# Patient Record
Sex: Female | Born: 1985 | Race: White | Hispanic: No | Marital: Married | Smoking: Current every day smoker
Health system: Southern US, Community
[De-identification: ages and names within clinical notes are randomized; demographics above are authoritative.]

## PROBLEM LIST (undated history)

## (undated) DIAGNOSIS — Z789 Other specified health status: Secondary | ICD-10-CM

## (undated) HISTORY — PX: WISDOM TOOTH EXTRACTION: SHX21

## (undated) HISTORY — PX: ANTERIOR CRUCIATE LIGAMENT REPAIR: SHX115

---

## 2011-05-06 LAB — OB RESULTS CONSOLE GC/CHLAMYDIA: Gonorrhea: NEGATIVE

## 2011-05-06 LAB — OB RESULTS CONSOLE HIV ANTIBODY (ROUTINE TESTING): HIV: NONREACTIVE

## 2011-05-28 NOTE — L&D Delivery Note (Signed)
Delivery Note  SVD viable female Apgars given by NICU team (not available now) over intact perineum with nuchal x 1 reduced.  Placenta delivered spontaneously intact with 3VC. Hymenal band tore during labor and was Repaired with 2-0 Chromic with good support and hemostasis noted and R/V exam confirms.  PH art was done.  [place] cord blood was not done.  Mother and baby were doing well.  EBL 300cc  Candice Camp, MD

## 2011-10-23 ENCOUNTER — Inpatient Hospital Stay (HOSPITAL_COMMUNITY)
Admission: AD | Admit: 2011-10-23 | Discharge: 2011-10-26 | DRG: 778 | Disposition: A | Discharge status: Home / Self Care | Payer: 59 | Source: Ambulatory Visit | Attending: Obstetrics and Gynecology | Admitting: Obstetrics and Gynecology

## 2011-10-23 ENCOUNTER — Encounter (HOSPITAL_COMMUNITY): Payer: Self-pay

## 2011-10-23 DIAGNOSIS — O4700 False labor before 37 completed weeks of gestation, unspecified trimester: Principal | ICD-10-CM | POA: Diagnosis present

## 2011-10-23 DIAGNOSIS — O169 Unspecified maternal hypertension, unspecified trimester: Secondary | ICD-10-CM

## 2011-10-23 DIAGNOSIS — O139 Gestational [pregnancy-induced] hypertension without significant proteinuria, unspecified trimester: Secondary | ICD-10-CM | POA: Diagnosis present

## 2011-10-23 DIAGNOSIS — O368390 Maternal care for abnormalities of the fetal heart rate or rhythm, unspecified trimester, not applicable or unspecified: Secondary | ICD-10-CM | POA: Diagnosis present

## 2011-10-23 HISTORY — DX: Other specified health status: Z78.9

## 2011-10-23 LAB — URINALYSIS, ROUTINE W REFLEX MICROSCOPIC
Hgb urine dipstick: NEGATIVE
Leukocytes, UA: NEGATIVE
Nitrite: NEGATIVE
Protein, ur: 30 mg/dL — AB
Specific Gravity, Urine: 1.02 (ref 1.005–1.030)
Urobilinogen, UA: 0.2 mg/dL (ref 0.0–1.0)

## 2011-10-23 LAB — URINE MICROSCOPIC-ADD ON

## 2011-10-23 LAB — COMPREHENSIVE METABOLIC PANEL
ALT: 14 U/L (ref 0–35)
Albumin: 2.9 g/dL — ABNORMAL LOW (ref 3.5–5.2)
Calcium: 9.2 mg/dL (ref 8.4–10.5)
GFR calc Af Amer: >90 mL/min (ref >90)
Glucose, Bld: 77 mg/dL (ref 70–99)
Sodium: 132 mEq/L — ABNORMAL LOW (ref 135–145)
Total Protein: 6.4 g/dL (ref 6.0–8.3)

## 2011-10-23 LAB — CBC
Hemoglobin: 13.1 g/dL (ref 12.0–15.0)
MCH: 28.9 pg (ref 26.0–34.0)
MCHC: 33.7 g/dL (ref 30.0–36.0)
RDW: 14 % (ref 11.5–15.5)

## 2011-10-23 LAB — URIC ACID: Uric Acid, Serum: 3.4 mg/dL (ref 2.4–7.0)

## 2011-10-23 LAB — PROTEIN / CREATININE RATIO, URINE
Protein Creatinine Ratio: 0.42 — ABNORMAL HIGH (ref 0.00–0.15)
Total Protein, Urine: 40.1 mg/dL

## 2011-10-23 MED ORDER — BETAMETHASONE SOD PHOS & ACET 6 (3-3) MG/ML IJ SUSP
12.0000 mg | Freq: Every day | INTRAMUSCULAR | Status: AC
  Administered 2011-10-23 – 2011-10-24 (×2): 12 mg via INTRAMUSCULAR
  Filled 2011-10-23 (×2): qty 2

## 2011-10-23 MED ORDER — ZOLPIDEM TARTRATE 10 MG PO TABS: 10.0000 mg | ORAL_TABLET | Freq: Every evening | ORAL | Status: DC | PRN

## 2011-10-23 MED ORDER — PRENATAL MULTIVITAMIN CH
1.0000 | ORAL_TABLET | Freq: Every day | ORAL | Status: DC
  Administered 2011-10-23 – 2011-10-26 (×4): 1 via ORAL
  Filled 2011-10-23 (×4): qty 1

## 2011-10-23 MED ORDER — CALCIUM CARBONATE ANTACID 500 MG PO CHEW: 2.0000 | CHEWABLE_TABLET | ORAL | Status: DC | PRN

## 2011-10-23 MED ORDER — ACETAMINOPHEN 325 MG PO TABS: 650.0000 mg | ORAL_TABLET | ORAL | Status: DC | PRN

## 2011-10-23 MED ORDER — DOCUSATE SODIUM 100 MG PO CAPS
100.0000 mg | ORAL_CAPSULE | Freq: Every day | ORAL | Status: DC
  Administered 2011-10-24 – 2011-10-26 (×3): 100 mg via ORAL
  Filled 2011-10-23 (×3): qty 1

## 2011-10-23 NOTE — H&P (Signed)
26 yo G1 @ 32+5 wks sent from office w/ elevated BP and contractions.  BP in office 140-150/80s.  Ctx Q3-9min while on NST.  No vb or lof.  Good FM.  Pt denies HA, n/v, and abd pain.  Past History - See [facility]ister  AF, VSS Gen - NAD Abd - gravid, NT Ext - NT, no clonus Cvx - closed per Dr Arelia Sneddon office exam  FFN + in office  A/P:  Admit 24 hr urine Rpt pre-e labs in am Bedrest BMZ Tocolysis prn

## 2011-10-23 NOTE — MAU Note (Signed)
Pt sent from MD office for PTL eval, and PIH eval, pt denies PIH s/s, no abnormal vag d/c changes or bleding. q

## 2011-10-23 NOTE — Plan of Care (Signed)
Problem: Consults Goal: Birthing Suites Patient Information Press F2 to bring up selections list   Pt < [redacted] weeks EGA     

## 2011-10-23 NOTE — MAU Provider Note (Signed)
Chief Complaint:  Hypertension   First Provider Initiated Contact with Patient 10/23/11 1509      HPI  Rebecca Richmond is a 26 y.o. G1P0 at [redacted]w[redacted]d sent from office with elevated BP.  She reports good fetal movement, denies LOF, vaginal bleeding, vaginal itching/burning, urinary symptoms, h/a, epigastric pain, visual disturbances, dizziness, n/v, or fever/chills.    Pregnancy Course: uncomplicated  Past Medical History: Past Medical History  Diagnosis Date  . No pertinent past medical history     Past Surgical History: Past Surgical History  Procedure Date  . Anterior cruciate ligament repair     R knee x2  . Wisdom tooth extraction     Family History: Family History  Problem Relation Age of Onset  . Anesthesia problems Neg Hx   . Hypotension Neg Hx   . Malignant hyperthermia Neg Hx   . Pseudochol deficiency Neg Hx     Social History: History  Substance Use Topics  . Smoking status: Never Smoker   . Smokeless tobacco: Never Used  . Alcohol Use: No    Allergies: No Known Allergies  Meds:  Prescriptions prior to admission  Medication Sig Dispense Refill  . acetaminophen (TYLENOL) 500 MG tablet Take 500 mg by mouth every 6 (six) hours as needed. pain      . fexofenadine-pseudoephedrine (ALLEGRA-D) 60-120 MG per tablet Take 1 tablet by mouth 2 (two) times daily. allergies      . Prenatal Vit-Fe Fumarate-FA (PRENATAL MULTIVITAMIN) TABS Take 1 tablet by mouth daily.          Physical Exam  Blood pressure 134/89, pulse 105, temperature 98 F (36.7 C), temperature source Oral, resp. rate 16, height 5' (1.524 m), weight 73.936 kg (163 lb), SpO2 100.00%. GENERAL: Well-developed, well-nourished female in no acute distress.  HEENT: normocephalic, good dentition HEART: normal rate RESP: normal effort ABDOMEN: Soft, nontender, nondistended, gravid.  EXTREMITIES: Nontender, no edema NEURO: alert and oriented  SPECULUM EXAM: Deferred Dilation: Closed Exam by:: dr.  Arelia Sneddon  Positive FFN by Dr Arelia Sneddon  FHT:  Baseline 135 , moderate variability, accelerations present, no decelerations Contractions: irregular q 2 mins   Labs: Results for orders placed during the hospital encounter of 10/23/11 (from the past 24 hour(s))  URINALYSIS, ROUTINE W REFLEX MICROSCOPIC     Status: Abnormal   Collection Time   10/23/11  1:34 PM      Component Value Range   Color, Urine YELLOW  YELLOW    APPearance CLEAR  CLEAR    Specific Gravity, Urine 1.020  1.005 - 1.030    pH 7.5  5.0 - 8.0    Glucose, UA NEGATIVE  NEGATIVE (mg/dL)   Hgb urine dipstick NEGATIVE  NEGATIVE    Bilirubin Urine NEGATIVE  NEGATIVE    Ketones, ur NEGATIVE  NEGATIVE (mg/dL)   Protein, ur 30 (*) NEGATIVE (mg/dL)   Urobilinogen, UA 0.2  0.0 - 1.0 (mg/dL)   Nitrite NEGATIVE  NEGATIVE    Leukocytes, UA NEGATIVE  NEGATIVE   URINE MICROSCOPIC-ADD ON     Status: Abnormal   Collection Time   10/23/11  1:34 PM      Component Value Range   Squamous Epithelial / LPF FEW (*) RARE    WBC, UA 0-2  <3 (WBC/hpf)  PROTEIN / CREATININE RATIO, URINE     Status: Abnormal   Collection Time   10/23/11  1:34 PM      Component Value Range   Creatinine, Urine 95.89  Total Protein, Urine 40.1     PROTEIN CREATININE RATIO 0.42 (*) 0.00 - 0.15   CBC     Status: Abnormal   Collection Time   10/23/11  3:15 PM      Component Value Range   WBC 15.7 (*) 4.0 - 10.5 (K/uL)   RBC 4.53  3.87 - 5.11 (MIL/uL)   Hemoglobin 13.1  12.0 - 15.0 (g/dL)   HCT 78.2  95.6 - 21.3 (%)   MCV 85.9  78.0 - 100.0 (fL)   MCH 28.9  26.0 - 34.0 (pg)   MCHC 33.7  30.0 - 36.0 (g/dL)   RDW 08.6  57.8 - 46.9 (%)   Platelets 208  150 - 400 (K/uL)  COMPREHENSIVE METABOLIC PANEL     Status: Abnormal   Collection Time   10/23/11  3:15 PM      Component Value Range   Sodium 132 (*) 135 - 145 (mEq/L)   Potassium 3.8  3.5 - 5.1 (mEq/L)   Chloride 101  96 - 112 (mEq/L)   CO2 22  19 - 32 (mEq/L)   Glucose, Bld 77  70 - 99 (mg/dL)   BUN 6  6  - 23 (mg/dL)   Creatinine, Ser 6.29  0.50 - 1.10 (mg/dL)   Calcium 9.2  8.4 - 52.8 (mg/dL)   Total Protein 6.4  6.0 - 8.3 (g/dL)   Albumin 2.9 (*) 3.5 - 5.2 (g/dL)   AST 19  0 - 37 (U/L)   ALT 14  0 - 35 (U/L)   Alkaline Phosphatase 139 (*) 39 - 117 (U/L)   Total Bilirubin 0.2 (*) 0.3 - 1.2 (mg/dL)   GFR calc non Af Amer >90  >90 (mL/min)   GFR calc Af Amer >90  >90 (mL/min)  URIC ACID     Status: Normal   Collection Time   10/23/11  3:15 PM      Component Value Range   Uric Acid, Serum 3.4  2.4 - 7.0 (mg/dL)    Assessment: Threatened preterm labor Hypertension in pregnancy   Plan: Called Dr Renaldo Fiddler to discuss findings and assessment Care assumed by Dr Renaldo Fiddler for admission to antepartum  LEFTWICH-KIRBY, Amarise Lillo 5/29/20133:18 PM

## 2011-10-23 NOTE — MAU Note (Signed)
Patient sent from the office for evaluation of elevated blood pressure. Patient denies any symptoms, contractions bleeding or leaking. Reports good fetal movement.

## 2011-10-24 LAB — COMPREHENSIVE METABOLIC PANEL
ALT: 13 U/L (ref 0–35)
AST: 17 U/L (ref 0–37)
Albumin: 2.7 g/dL — ABNORMAL LOW (ref 3.5–5.2)
Alkaline Phosphatase: 114 U/L (ref 39–117)
Chloride: 102 mEq/L (ref 96–112)
Potassium: 4.5 mEq/L (ref 3.5–5.1)
Total Bilirubin: 0.3 mg/dL (ref 0.3–1.2)

## 2011-10-24 LAB — CBC
Platelets: 194 10*3/uL (ref 150–400)
RDW: 13.8 % (ref 11.5–15.5)
WBC: 16.9 10*3/uL — ABNORMAL HIGH (ref 4.0–10.5)

## 2011-10-24 LAB — CREATININE CLEARANCE, URINE, 24 HOUR
Creatinine, Urine: 57.07 mg/dL
Urine Total Volume-CRCL: 2500 mL

## 2011-10-24 LAB — PROTEIN, URINE, 24 HOUR
Collection Interval-UPROT: 24 hours
Protein, 24H Urine: 250 mg/d — ABNORMAL HIGH (ref 50–100)
Urine Total Volume-UPROT: 2500 mL

## 2011-10-24 MED ORDER — SODIUM CHLORIDE 0.9 % IJ SOLN
3.0000 mL | Freq: Two times a day (BID) | INTRAMUSCULAR | Status: DC
  Administered 2011-10-24 – 2011-10-25 (×4): 3 mL via INTRAVENOUS

## 2011-10-24 MED ORDER — NIFEDIPINE 10 MG PO CAPS
10.0000 mg | ORAL_CAPSULE | Freq: Four times a day (QID) | ORAL | Status: DC
  Administered 2011-10-24 – 2011-10-26 (×9): 10 mg via ORAL
  Filled 2011-10-24 (×10): qty 1

## 2011-10-24 NOTE — Progress Notes (Signed)
This morning, the patient is sitting up and eating breakfast.  She has no complaints. Denies headache, vaginal bleeding or any cramping. She reports normal fetal movement.  Afebrile Vital signs stable Fetal heart rate is reactive Toco irregular contractions Abdomen is soft and non tender  IMPRESSION: IUP at 32 w 6 days Positive FFN Pre term contractions Gestational hypertension PLAN: Monitor for PTL. Cervix was long / closed on admission. Discussed with her signs/symptoms.  If discharged home tomorrow, may need to go home on bedrest. Complete 24 hour urine collection and steroid series.  Plan of care discussed with patient and her husband.

## 2011-10-24 NOTE — Plan of Care (Signed)
Problem: Phase I Progression Outcomes Goal: Contractions < 5-6/hour Outcome: Not Met (add Reason)  Contracting, notified md, orders received for procardia

## 2011-10-25 MED ORDER — TERBUTALINE SULFATE 1 MG/ML IJ SOLN
0.2500 mg | Freq: Once | INTRAMUSCULAR | Status: AC
  Administered 2011-10-25: 0.25 mg via SUBCUTANEOUS
  Filled 2011-10-25: qty 1

## 2011-10-25 NOTE — Progress Notes (Signed)
UR Chart review completed.  

## 2011-10-25 NOTE — Progress Notes (Signed)
Patient ID: Rebecca Richmond, female   DOB: February 13, 1986, 26 y.o.   MRN: [id number] Pt without complaints VSSAF BP 140/70s Labs Stable 24 Urine 250mg   DTRs 2/4 Abd Gravid Nt  IUP at 33 weeks ?PTL - stable on Procardia Gest HTN - stable Plan monitor to day.  Recheck labs in am, if stable consider oupt management DL

## 2011-10-26 LAB — COMPREHENSIVE METABOLIC PANEL
ALT: 11 U/L (ref 0–35)
AST: 13 U/L (ref 0–37)
CO2: 24 mEq/L (ref 19–32)
Chloride: 106 mEq/L (ref 96–112)
Creatinine, Ser: 0.6 mg/dL (ref 0.50–1.10)
GFR calc non Af Amer: >90 mL/min (ref >90)
Total Bilirubin: 0.1 mg/dL — ABNORMAL LOW (ref 0.3–1.2)

## 2011-10-26 LAB — CBC
Hemoglobin: 11.4 g/dL — ABNORMAL LOW (ref 12.0–15.0)
MCV: 87.8 fL (ref 78.0–100.0)
Platelets: 178 10*3/uL (ref 150–400)
RBC: 4.02 MIL/uL (ref 3.87–5.11)
WBC: 12.8 10*3/uL — ABNORMAL HIGH (ref 4.0–10.5)

## 2011-10-26 LAB — LACTATE DEHYDROGENASE: LDH: 128 U/L (ref 94–250)

## 2011-10-26 MED ORDER — NIFEDIPINE 10 MG PO CAPS: 10.0000 mg | ORAL_CAPSULE | Freq: Four times a day (QID) | ORAL | Status: DC

## 2011-10-26 NOTE — Discharge Instructions (Signed)
Hypertension During Pregnancy Hypertension is also called high blood pressure. Blood pressure moves blood in your body. Sometimes, the force that moves the blood becomes too strong. When you are pregnant, this condition should be watched carefully. It can cause problems for you and your baby. HOME CARE   Make and keep all of your doctor visits.   Take medicine as told by your doctor. Tell your doctor about all medicines you take.   Eat very little salt.   Exercise regularly.   Do not drink alcohol.   Do not smoke.   Do not have drinks with caffeine.   Lie on your left side when resting.  GET HELP RIGHT AWAY IF:  You have bad belly (abdominal) pain.   You have sudden puffiness (swelling) in the hands, ankles, or face.   You gain 4 pounds (1.8 kilograms) or more in 1 week.   You throw up (vomit) repeatedly.   You have bleeding from the vagina.   You do not feel the baby moving as much.   You have a headache.   You have blurred or double vision.   You have muscle twitching or spasms.   You have shortness of breath.   You have blue fingernails and lips.   You have blood in your pee (urine).  MAKE SURE YOU:  Understand these instructions.   Will watch your condition.   Will get help right away if you are not doing well.  Document Released: 06/15/2010 Document Revised: 05/02/2011 Document Reviewed: 12/28/2010 [employer] Patient Information 2012 [employer]. 

## 2011-10-26 NOTE — Discharge Summary (Signed)
  Pt without complaints today VSSAF BPS 110-130 / 70-80s   FHR 140s with accels Ctxs 2x/hr  Abd Gravid NT DTRs 2/4  Labs nl and stable  IUP at 33 1/7 Gest HTN - stable BP and Labs Modified BR and PIH warnings  Threatened PTL - FFN +, SP BMZ and stable on procardia with rare ctxs and no cx change Modified BR and FU in 2 days in office  DL

## 2011-11-04 ENCOUNTER — Inpatient Hospital Stay (HOSPITAL_COMMUNITY): Payer: 59 | Admitting: Anesthesiology

## 2011-11-04 ENCOUNTER — Encounter (HOSPITAL_COMMUNITY): Payer: Self-pay | Admitting: Anesthesiology

## 2011-11-04 ENCOUNTER — Encounter (HOSPITAL_COMMUNITY): Payer: Self-pay | Admitting: *Deleted

## 2011-11-04 ENCOUNTER — Inpatient Hospital Stay (HOSPITAL_COMMUNITY)
Admission: AD | Admit: 2011-11-04 | Discharge: 2011-11-06 | DRG: 775 | Disposition: A | Discharge status: Home / Self Care | Payer: 59 | Source: Ambulatory Visit | Attending: Obstetrics and Gynecology | Admitting: Obstetrics and Gynecology

## 2011-11-04 DIAGNOSIS — O26899 Other specified pregnancy related conditions, unspecified trimester: Secondary | ICD-10-CM | POA: Diagnosis present

## 2011-11-04 DIAGNOSIS — L299 Pruritus, unspecified: Secondary | ICD-10-CM | POA: Diagnosis present

## 2011-11-04 LAB — CBC
HCT: 42.6 % (ref 36.0–46.0)
Hemoglobin: 14.3 g/dL (ref 12.0–15.0)
MCH: 28.9 pg (ref 26.0–34.0)
MCHC: 33.6 g/dL (ref 30.0–36.0)
MCV: 86.1 fL (ref 78.0–100.0)

## 2011-11-04 MED ORDER — BUTORPHANOL TARTRATE 2 MG/ML IJ SOLN
1.0000 mg | Freq: Once | INTRAMUSCULAR | Status: AC
  Administered 2011-11-04: 1 mg via INTRAVENOUS

## 2011-11-04 MED ORDER — TERBUTALINE SULFATE 1 MG/ML IJ SOLN: 0.2500 mg | Freq: Once | INTRAMUSCULAR | Status: DC | PRN

## 2011-11-04 MED ORDER — ONDANSETRON HCL 4 MG/2ML IJ SOLN: 4.0000 mg | Freq: Four times a day (QID) | INTRAMUSCULAR | Status: DC | PRN

## 2011-11-04 MED ORDER — EPHEDRINE 5 MG/ML INJ
10.0000 mg | INTRAVENOUS | Status: DC | PRN
  Filled 2011-11-04: qty 4

## 2011-11-04 MED ORDER — SODIUM CHLORIDE 0.9 % IV SOLN
2.0000 g | Freq: Four times a day (QID) | INTRAVENOUS | Status: DC
  Administered 2011-11-04: 2 g via INTRAVENOUS
  Filled 2011-11-04 (×2): qty 2000

## 2011-11-04 MED ORDER — BUTORPHANOL TARTRATE 2 MG/ML IJ SOLN
INTRAMUSCULAR | Status: AC
  Administered 2011-11-04: 1 mg via INTRAVENOUS
  Filled 2011-11-04: qty 1

## 2011-11-04 MED ORDER — OXYTOCIN BOLUS FROM INFUSION
500.0000 mL | Freq: Once | INTRAVENOUS | Status: DC
  Filled 2011-11-04: qty 1000
  Filled 2011-11-04: qty 500

## 2011-11-04 MED ORDER — LACTATED RINGERS IV SOLN: 500.0000 mL | Freq: Once | INTRAVENOUS | Status: DC

## 2011-11-04 MED ORDER — FENTANYL 2.5 MCG/ML BUPIVACAINE 1/10 % EPIDURAL INFUSION (WH - ANES)
14.0000 mL/h | INTRAMUSCULAR | Status: DC
  Administered 2011-11-04: 14 mL/h via EPIDURAL
  Filled 2011-11-04 (×2): qty 60

## 2011-11-04 MED ORDER — LIDOCAINE HCL (PF) 1 % IJ SOLN
30.0000 mL | INTRAMUSCULAR | Status: DC | PRN
  Filled 2011-11-04: qty 30

## 2011-11-04 MED ORDER — EPHEDRINE 5 MG/ML INJ: 10.0000 mg | INTRAVENOUS | Status: DC | PRN

## 2011-11-04 MED ORDER — CITRIC ACID-SODIUM CITRATE 334-500 MG/5ML PO SOLN: 30.0000 mL | ORAL | Status: DC | PRN

## 2011-11-04 MED ORDER — OXYTOCIN 20 UNITS IN LACTATED RINGERS INFUSION - SIMPLE
125.0000 mL/h | Freq: Once | INTRAVENOUS | Status: DC
  Filled 2011-11-04: qty 1000

## 2011-11-04 MED ORDER — PRENATAL MULTIVITAMIN CH
1.0000 | ORAL_TABLET | Freq: Every day | ORAL | Status: DC
  Administered 2011-11-04: 1 via ORAL
  Filled 2011-11-04: qty 1

## 2011-11-04 MED ORDER — FENTANYL 2.5 MCG/ML BUPIVACAINE 1/10 % EPIDURAL INFUSION (WH - ANES)
INTRAMUSCULAR | Status: DC | PRN
  Administered 2011-11-04: 14 mL/h via EPIDURAL

## 2011-11-04 MED ORDER — ACETAMINOPHEN 325 MG PO TABS: 650.0000 mg | ORAL_TABLET | ORAL | Status: DC | PRN

## 2011-11-04 MED ORDER — TERBUTALINE SULFATE 1 MG/ML IJ SOLN
0.2500 mg | Freq: Once | INTRAMUSCULAR | Status: AC | PRN
  Administered 2011-11-04: 0.25 mg via SUBCUTANEOUS

## 2011-11-04 MED ORDER — TERBUTALINE SULFATE 1 MG/ML IJ SOLN
INTRAMUSCULAR | Status: AC
  Administered 2011-11-04: 0.25 mg via SUBCUTANEOUS
  Filled 2011-11-04: qty 1

## 2011-11-04 MED ORDER — OXYTOCIN 20 UNITS IN LACTATED RINGERS INFUSION - SIMPLE
1.0000 m[IU]/min | INTRAVENOUS | Status: DC
  Administered 2011-11-04: 1 m[IU]/min via INTRAVENOUS

## 2011-11-04 MED ORDER — ZOLPIDEM TARTRATE 10 MG PO TABS: 10.0000 mg | ORAL_TABLET | Freq: Every evening | ORAL | Status: DC | PRN

## 2011-11-04 MED ORDER — IBUPROFEN 600 MG PO TABS: 600.0000 mg | ORAL_TABLET | Freq: Four times a day (QID) | ORAL | Status: DC | PRN

## 2011-11-04 MED ORDER — OXYCODONE-ACETAMINOPHEN 5-325 MG PO TABS: 1.0000 | ORAL_TABLET | ORAL | Status: DC | PRN

## 2011-11-04 MED ORDER — DIPHENHYDRAMINE HCL 50 MG/ML IJ SOLN: 12.5000 mg | INTRAMUSCULAR | Status: DC | PRN

## 2011-11-04 MED ORDER — CALCIUM CARBONATE ANTACID 500 MG PO CHEW: 2.0000 | CHEWABLE_TABLET | ORAL | Status: DC | PRN

## 2011-11-04 MED ORDER — FLEET ENEMA 7-19 GM/118ML RE ENEM: 1.0000 | ENEMA | RECTAL | Status: DC | PRN

## 2011-11-04 MED ORDER — PHENYLEPHRINE 40 MCG/ML (10ML) SYRINGE FOR IV PUSH (FOR BLOOD PRESSURE SUPPORT)
80.0000 ug | PREFILLED_SYRINGE | INTRAVENOUS | Status: DC | PRN
  Filled 2011-11-04: qty 5

## 2011-11-04 MED ORDER — LACTATED RINGERS IV SOLN
INTRAVENOUS | Status: DC
  Administered 2011-11-04: 22:00:00 via INTRAVENOUS

## 2011-11-04 MED ORDER — NIFEDIPINE 10 MG PO CAPS
10.0000 mg | ORAL_CAPSULE | Freq: Four times a day (QID) | ORAL | Status: DC
Start: 2011-11-04 — End: 2011-11-04
  Administered 2011-11-04: 10 mg via ORAL
  Filled 2011-11-04: qty 1

## 2011-11-04 MED ORDER — LACTATED RINGERS IV SOLN: 500.0000 mL | INTRAVENOUS | Status: DC | PRN

## 2011-11-04 MED ORDER — LACTATED RINGERS IV SOLN
INTRAVENOUS | Status: DC
  Administered 2011-11-04: 17:00:00 via INTRAVENOUS
  Administered 2011-11-04: 500 mL via INTRAVENOUS

## 2011-11-04 MED ORDER — LIDOCAINE HCL (PF) 1 % IJ SOLN
INTRAMUSCULAR | Status: DC | PRN
  Administered 2011-11-04 (×2): 8 mL

## 2011-11-04 MED ORDER — DOCUSATE SODIUM 100 MG PO CAPS
100.0000 mg | ORAL_CAPSULE | Freq: Every day | ORAL | Status: DC
  Administered 2011-11-04: 100 mg via ORAL
  Filled 2011-11-04: qty 1

## 2011-11-04 MED ORDER — PHENYLEPHRINE 40 MCG/ML (10ML) SYRINGE FOR IV PUSH (FOR BLOOD PRESSURE SUPPORT): 80.0000 ug | PREFILLED_SYRINGE | INTRAVENOUS | Status: DC | PRN

## 2011-11-04 NOTE — Progress Notes (Signed)
Patient ID: Rebecca Richmond, female   DOB: [birth year], 26 y.o.   MRN: [id number] Pt not responding to meds/fluids now with more pain with ctxs VSSAF FHR 140s Ctxs q 3-4 Cx 7/80/BBOW  Now active labor Abx for GBS prophylaxis Antipate SVD DL

## 2011-11-04 NOTE — Anesthesia Procedure Notes (Signed)
Epidural Patient location during procedure: OB Start time: 11/04/2011 6:17 PM End time: 11/04/2011 6:21 PM Reason for block: procedure for pain  Staffing Anesthesiologist: Sandrea Hughs Performed by: anesthesiologist   Preanesthetic Checklist Completed: patient identified, site marked, surgical consent, pre-op evaluation, timeout performed, IV checked, risks and benefits discussed and monitors and equipment checked  Epidural Patient position: sitting Prep: site prepped and draped and DuraPrep Patient monitoring: continuous pulse ox and blood pressure Approach: midline Injection technique: LOR air  Needle:  Needle type: Tuohy  Needle gauge: 17 G Needle length: 9 cm Needle insertion depth: 5 cm cm Catheter type: closed end flexible Catheter size: 19 Gauge Catheter at skin depth: 10 cm Test dose: negative and Other  Assessment Sensory level: T8 Events: blood not aspirated, injection not painful, no injection resistance, negative IV test and no paresthesia

## 2011-11-04 NOTE — MAU Note (Signed)
Pt sent over from office due to ucs.  Had cervix checked in office and was 4cm.  Denies any painful ucs, feeling a lot of pressure.  Was closed last week when seen in MAU for blood pressure eval and cramping. + FM.  Denies any bleeding or leaking of fluid.

## 2011-11-04 NOTE — H&P (Signed)
Rebecca Richmond is a 26 y.o. female seen in the office for a ob visit and found to be contracting and 4 cm dilated.  had pos FFn 1 week ago and was hospitalized and sent home on procardia.  Stopped the procardia 3 days ago because of itchy rash on abd, with no resolution of sxs.  Sent to antepartum for eval.History OB History    Grav Para Term Preterm Abortions TAB SAB Ect Mult Living   1 0 0 0 0 0 0 0 0 0      Past Medical History  Diagnosis Date  . No pertinent past medical history    Past Surgical History  Procedure Date  . Anterior cruciate ligament repair     R knee x2  . Wisdom tooth extraction    Family History: family history is negative for Anesthesia problems, and Hypotension, and Malignant hyperthermia, and Pseudochol deficiency, . Social History:  reports that she has never smoked. She has never used smokeless tobacco. She reports that she does not drink alcohol or use illicit drugs.  ROS    Blood pressure 120/61, pulse 115, temperature 98.3 F (36.8 C), temperature source Oral, resp. rate 18, height 5' (1.524 m), weight 73.936 kg (163 lb). 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

## 2011-11-04 NOTE — Anesthesia Preprocedure Evaluation (Signed)

## 2011-11-04 NOTE — MAU Note (Signed)
Was here last wk, contrations- but not painful, started feeling crampy last night, had clear d/c- sent from office for further eval, pt is NOT ruptured.

## 2011-11-04 NOTE — Progress Notes (Signed)
Orders received to start pitocin.

## 2011-11-04 NOTE — MAU Note (Signed)
Pt was to be a direct admit per pt, Dr Rana Snare called-  Is to go to antenatal for monitoring and bed rest- antenatal to call for orders

## 2011-11-04 NOTE — Consult Note (Signed)
Neonatology Note:  Attendance at Delivery:  I was asked to attend this NSVD at 34 3/[redacted] weeks GA after onset of PTL. The mother is a G1P0 A pos, GBS unknown with threatened preterm labor on 5/29, received Betamethasone times 2 doses then. She had been on Procardia until 3 days ago. She began having contractions today and was admitted and treated with terbutaline. Once it was clear that labor would progress, Ampicillin was started due to unknown GBS status. Mother afebrile during labor. ROM 4 hours prior to delivery, fluid clear. Infant vigorous with good spontaneous cry and tone. Needed only minimal bulb suctioning. Ap 8/9. Lungs clear to ausc in DR, without distress. Held by parents briefly, then transported to the NICU in good condition.  Rebecca Kozicki, MD  

## 2011-11-05 ENCOUNTER — Encounter (HOSPITAL_COMMUNITY): Payer: Self-pay | Admitting: *Deleted

## 2011-11-05 LAB — CBC
Hemoglobin: 11.8 g/dL — ABNORMAL LOW (ref 12.0–15.0)
MCH: 28.6 pg (ref 26.0–34.0)
MCV: 85.7 fL (ref 78.0–100.0)
RBC: 4.12 MIL/uL (ref 3.87–5.11)

## 2011-11-05 MED ORDER — PRENATAL MULTIVITAMIN CH
1.0000 | ORAL_TABLET | Freq: Every day | ORAL | Status: DC
  Administered 2011-11-05 – 2011-11-06 (×2): 1 via ORAL
  Filled 2011-11-05 (×2): qty 1

## 2011-11-05 MED ORDER — LANOLIN HYDROUS EX OINT: TOPICAL_OINTMENT | CUTANEOUS | Status: DC | PRN

## 2011-11-05 MED ORDER — WITCH HAZEL-GLYCERIN EX PADS: 1.0000 "application " | MEDICATED_PAD | CUTANEOUS | Status: DC | PRN

## 2011-11-05 MED ORDER — PRENATAL MULTIVITAMIN CH: 1.0000 | ORAL_TABLET | Freq: Every day | ORAL | Status: DC

## 2011-11-05 MED ORDER — MEASLES, MUMPS & RUBELLA VAC ~~LOC~~ INJ
0.5000 mL | INJECTION | Freq: Once | SUBCUTANEOUS | Status: DC
  Filled 2011-11-05: qty 0.5

## 2011-11-05 MED ORDER — ONDANSETRON HCL 4 MG PO TABS: 4.0000 mg | ORAL_TABLET | ORAL | Status: DC | PRN

## 2011-11-05 MED ORDER — ZOLPIDEM TARTRATE 5 MG PO TABS: 5.0000 mg | ORAL_TABLET | Freq: Every evening | ORAL | Status: DC | PRN

## 2011-11-05 MED ORDER — DIPHENHYDRAMINE HCL 25 MG PO CAPS: 25.0000 mg | ORAL_CAPSULE | Freq: Four times a day (QID) | ORAL | Status: DC | PRN

## 2011-11-05 MED ORDER — ONDANSETRON HCL 4 MG/2ML IJ SOLN: 4.0000 mg | INTRAMUSCULAR | Status: DC | PRN

## 2011-11-05 MED ORDER — SENNOSIDES-DOCUSATE SODIUM 8.6-50 MG PO TABS
2.0000 | ORAL_TABLET | Freq: Every day | ORAL | Status: DC
  Administered 2011-11-05: 2 via ORAL

## 2011-11-05 MED ORDER — IBUPROFEN 600 MG PO TABS
600.0000 mg | ORAL_TABLET | Freq: Four times a day (QID) | ORAL | Status: DC
  Administered 2011-11-05 – 2011-11-06 (×7): 600 mg via ORAL
  Filled 2011-11-05 (×7): qty 1

## 2011-11-05 MED ORDER — OXYCODONE-ACETAMINOPHEN 5-325 MG PO TABS: 1.0000 | ORAL_TABLET | ORAL | Status: DC | PRN

## 2011-11-05 MED ORDER — MEDROXYPROGESTERONE ACETATE 150 MG/ML IM SUSP: 150.0000 mg | INTRAMUSCULAR | Status: DC | PRN

## 2011-11-05 MED ORDER — BENZOCAINE-MENTHOL 20-0.5 % EX AERO
1.0000 "application " | INHALATION_SPRAY | CUTANEOUS | Status: DC | PRN
  Administered 2011-11-06: 1 via TOPICAL
  Filled 2011-11-05: qty 56

## 2011-11-05 MED ORDER — SIMETHICONE 80 MG PO CHEW
80.0000 mg | CHEWABLE_TABLET | ORAL | Status: DC | PRN
  Administered 2011-11-06: 80 mg via ORAL

## 2011-11-05 MED ORDER — TETANUS-DIPHTH-ACELL PERTUSSIS 5-2.5-18.5 LF-MCG/0.5 IM SUSP: 0.5000 mL | Freq: Once | INTRAMUSCULAR | Status: DC

## 2011-11-05 MED ORDER — DIBUCAINE 1 % RE OINT: 1.0000 "application " | TOPICAL_OINTMENT | RECTAL | Status: DC | PRN

## 2011-11-05 NOTE — Anesthesia Postprocedure Evaluation (Signed)
  Anesthesia Post-op Note  Patient: Rebecca Richmond  Procedure(s) Performed: * No procedures listed *  Patient Location: PACU and Mother/Baby  Anesthesia Type: Spinal  Level of Consciousness: awake, alert  and oriented  Airway and Oxygen Therapy: Patient Spontanous Breathing  Post-op Pain: mild  Post-op Assessment: Patient's Cardiovascular Status Stable, Respiratory Function Stable, No signs of Nausea or vomiting, No headache, No backache, No residual numbness and No residual motor weakness  Post-op Vital Signs: stable  Complications: No apparent anesthesia complications

## 2011-11-05 NOTE — Progress Notes (Signed)
UR Chart review completed.  

## 2011-11-05 NOTE — Progress Notes (Signed)
Post Partum Day one Subjective: no complaints, up ad lib, voiding, tolerating PO and + flatus  Objective: Blood pressure 120/79, pulse 89, temperature 98.4 F (36.9 C), temperature source Oral, resp. rate 20, height 5' (1.524 m), weight 73.936 kg (163 lb), SpO2 95.00%, unknown if currently breastfeeding.  Physical Exam:  General: alert Lochia: appropriate Uterine Fundus: firm Incision: healing well DVT Evaluation: No evidence of DVT seen on physical exam.   Basename 11/05/11 0535 11/04/11 1315  HGB 11.8* 14.3  HCT 35.3* 42.6    Assessment/Plan: Plan for discharge tomorrow   LOS: 1 day   Rebecca Richmond S 11/05/2011, 7:47 AM

## 2011-11-06 ENCOUNTER — Encounter (HOSPITAL_COMMUNITY)
Admission: RE | Admit: 2011-11-06 | Discharge: 2011-11-06 | Disposition: A | Discharge status: Home / Self Care | Payer: 59 | Source: Ambulatory Visit | Attending: Obstetrics and Gynecology | Admitting: Obstetrics and Gynecology

## 2011-11-06 ENCOUNTER — Encounter (HOSPITAL_COMMUNITY): Payer: Self-pay | Admitting: *Deleted

## 2011-11-06 DIAGNOSIS — O923 Agalactia: Secondary | ICD-10-CM | POA: Insufficient documentation

## 2011-11-06 MED ORDER — IBUPROFEN 600 MG PO TABS: 600.0000 mg | ORAL_TABLET | Freq: Four times a day (QID) | ORAL | Status: AC

## 2011-11-06 NOTE — Progress Notes (Signed)
Post Partum Day 2 Subjective: no complaints, up ad lib, voiding, tolerating PO, + flatus and baby stable in NICU  Objective: Blood pressure 119/74, pulse 76, temperature 97.8 F (36.6 C), temperature source Oral, resp. rate 18, height 5' (1.524 m), weight 73.936 kg (163 lb), SpO2 96.00%, unknown if currently breastfeeding.  Physical Exam:  General: alert and cooperative Lochia: appropriate Uterine Fundus: firm Incision: perineum intact DVT Evaluation: No evidence of DVT seen on physical exam.   Basename 11/05/11 0535 11/04/11 1315  HGB 11.8* 14.3  HCT 35.3* 42.6    Assessment/Plan: Discharge home   LOS: 2 days   Danique Hartsough G 11/06/2011, 8:16 AM

## 2011-11-06 NOTE — Progress Notes (Signed)
Pt ambulated out  Tolerated  Well  Teaching complete

## 2011-11-06 NOTE — Discharge Summary (Signed)
Obstetric Discharge Summary Reason for Admission: onset of labor Prenatal Procedures: ultrasound Intrapartum Procedures: spontaneous vaginal delivery Postpartum Procedures: none Complications-Operative and Postpartum: none Hemoglobin  Date Value Range Status  11/05/2011 11.8* 12.0 - 15.0 g/dL Final     REPEATED TO VERIFY     DELTA CHECK NOTED     HCT  Date Value Range Status  11/05/2011 35.3* 36.0 - 46.0 % Final    Physical Exam:  General: alert and cooperative Lochia: appropriate Uterine Fundus: firm Incision: perineum intact DVT Evaluation: No evidence of DVT seen on physical exam.  Discharge Diagnoses: s/p vag preterm delivery at 34 weeks  Discharge Information: Date: 11/06/2011 Activity: pelvic rest Diet: routine Medications: PNV and Ibuprofen Condition: stable Instructions: refer to practice specific booklet Discharge to: home   Newborn Data: Live born female  Birth Weight: 5 lb 11.3 oz (2588 g) APGAR: 8, 9  Home with nicu.  Rebecca Richmond G 11/06/2011, 8:40 AM

## 2014-03-28 ENCOUNTER — Encounter (HOSPITAL_COMMUNITY): Payer: Self-pay | Admitting: *Deleted

## 2014-06-20 ENCOUNTER — Ambulatory Visit (INDEPENDENT_AMBULATORY_CARE_PROVIDER_SITE_OTHER): Payer: 59 | Admitting: Physician Assistant

## 2014-06-20 VITALS — BP 142/88 | HR 107 | Temp 99.1°F | Resp 20 | Ht 60.0 in | Wt 149.0 lb

## 2014-06-20 DIAGNOSIS — J019 Acute sinusitis, unspecified: Secondary | ICD-10-CM

## 2014-06-20 DIAGNOSIS — R05 Cough: Secondary | ICD-10-CM

## 2014-06-20 DIAGNOSIS — R059 Cough, unspecified: Secondary | ICD-10-CM

## 2014-06-20 DIAGNOSIS — J029 Acute pharyngitis, unspecified: Secondary | ICD-10-CM

## 2014-06-20 LAB — POCT RAPID STREP A (OFFICE): RAPID STREP A SCREEN: NEGATIVE

## 2014-06-20 MED ORDER — HYDROCOD POLST-CHLORPHEN POLST 10-8 MG/5ML PO LQCR: 5.0000 mL | Freq: Two times a day (BID) | ORAL | Status: DC | PRN

## 2014-06-20 MED ORDER — AMOXICILLIN 875 MG PO TABS: 875.0000 mg | ORAL_TABLET | Freq: Two times a day (BID) | ORAL | Status: AC

## 2014-06-20 MED ORDER — IPRATROPIUM BROMIDE 0.03 % NA SOLN: 2.0000 | Freq: Two times a day (BID) | NASAL | Status: DC

## 2014-06-20 NOTE — Patient Instructions (Signed)
Take antibiotic until finished. Use nasal spray twice a day. Use cough syrup at night. I will let you know the results of your throat culture. Return if not improving in 7-10 days.

## 2014-06-20 NOTE — Progress Notes (Signed)
Subjective:    Patient ID: Rebecca Richmond, female    DOB: Jun 04, 1985, 29 y.o.   MRN: [id number]  HPI  This is a 29 year old female with no PMH who is presenting with 2 weeks of URI symptoms - cough, nasal congestion, sore throat, sinus pressure. She reports she took ibuprofen, mucinex and theraflu and was helping some. 2 days ago her symptoms started to worsen. She reports she is having severe sore throat especially on the left side. Cough is productive of green sputum especially in the mornings. She is not sleeping well at night d/t cough. She was initially having nasal discharge however she is no longer having discharge and instead is having sinus pressure and postnasal drip. She states her teeth hurt when she eats. She denies fever, chills, otalgia, SOB or wheezing. She does not have a history of asthma and is not a smoker.  Review of Systems  Constitutional: Negative for fever and chills.  HENT: Positive for congestion, sinus pressure and sore throat. Negative for ear pain.   Eyes: Negative for redness.  Respiratory: Positive for cough. Negative for shortness of breath and wheezing.   Gastrointestinal: Negative for nausea, vomiting, abdominal pain and diarrhea.  Skin: Negative for rash.  Allergic/Immunologic: Positive for environmental allergies (spring time).  Neurological: Positive for headaches.  Hematological: Negative for adenopathy.  Psychiatric/Behavioral: Positive for sleep disturbance.   There are no active problems to display for this patient.  Prior to Admission medications   Medication Sig Start Date End Date Taking? Authorizing Provider  acetaminophen (TYLENOL) 500 MG tablet Take 500 mg by mouth every 6 (six) hours as needed. pain   Yes Historical Provider, MD   No Known Allergies  Patient's social and family history were reviewed.     Objective:   Physical Exam  Constitutional: She is oriented to person, place, and time. She appears well-developed and  well-nourished. No distress.  HENT:  Head: Normocephalic and atraumatic.  Right Ear: Hearing, external ear and ear canal normal. Tympanic membrane is retracted.  Left Ear: Hearing, external ear and ear canal normal.  Nose: Right sinus exhibits no maxillary sinus tenderness and no frontal sinus tenderness. Left sinus exhibits maxillary sinus tenderness. Left sinus exhibits no frontal sinus tenderness.  Mouth/Throat: Uvula is midline and mucous membranes are normal. Posterior oropharyngeal erythema (some satellite erythematous lesions of soft palate) present. No oropharyngeal exudate or posterior oropharyngeal edema.  Left TM dull   Eyes: Conjunctivae and lids are normal. Right eye exhibits no discharge. Left eye exhibits no discharge. No scleral icterus.  Cardiovascular: Normal rate, regular rhythm, normal heart sounds, intact distal pulses and normal pulses.   No murmur heard. Pulmonary/Chest: Effort normal. No respiratory distress. She has no wheezes. She has no rhonchi. She has no rales.  Musculoskeletal: Normal range of motion.  Lymphadenopathy:       Head (right side): No submental, no submandibular, no tonsillar, no preauricular and no posterior auricular adenopathy present.       Head (left side): No submental, no submandibular, no tonsillar, no preauricular and no posterior auricular adenopathy present.    She has cervical adenopathy.  Shotty bilateral cervical lymphadenopathy  Neurological: She is alert and oriented to person, place, and time.  Skin: Skin is warm, dry and intact. No lesion and no rash noted.  Psychiatric: She has a normal mood and affect. Her speech is normal and behavior is normal. Thought content normal.   BP 142/88 mmHg  Pulse 107  Temp(Src)  99.1 F (37.3 C)  Resp 20  Ht 5' (1.524 m)  Wt 149 lb (67.586 kg)  BMI 29.10 kg/m2  SpO2 97%  LMP 06/14/2014  Results for orders placed or performed in visit on 06/20/14  POCT rapid strep A  Result Value Ref Range     Rapid Strep A Screen Negative Negative       Assessment & Plan:  1. Sore throat 2. Acute sinusitis 3. Cough Rapid strep negative, culture pending. Suspect sinusitis d/t prolonged symptoms and sinus tenderness. Will treat with abx, atrovent, and tussionex. She should return if symptoms do not improve/resolve after course of abx.  - POCT rapid strep A - Culture, Group A Strep - ipratropium (ATROVENT) 0.03 % nasal spray; Place 2 sprays into both nostrils 2 (two) times daily.  Dispense: 30 mL; Refill: 0 - amoxicillin (AMOXIL) 875 MG tablet; Take 1 tablet (875 mg total) by mouth 2 (two) times daily.  Dispense: 20 tablet; Refill: 0 - chlorpheniramine-HYDROcodone (TUSSIONEX PENNKINETIC ER) 10-8 MG/5ML LQCR; Take 5 mLs by mouth every 12 (twelve) hours as needed for cough (cough).  Dispense: 80 mL; Refill: 0   Nicole V. Dyke BrackettBush, PA-C, [facility]  06/21/2014

## 2014-06-23 LAB — CULTURE, GROUP A STREP: ORGANISM ID, BACTERIA: NORMAL

## 2014-07-15 ENCOUNTER — Other Ambulatory Visit: Payer: Self-pay | Admitting: Physician Assistant

## 2014-07-18 NOTE — Telephone Encounter (Signed)
Nicole, do you want to give RFs? 

## 2014-07-19 NOTE — Telephone Encounter (Signed)
Pt was given atrovent for sinus infection. If she is not getting better, she should RTC. Atrovent will not be refilled.

## 2015-05-19 ENCOUNTER — Inpatient Hospital Stay (HOSPITAL_COMMUNITY)
Admission: AD | Admit: 2015-05-19 | Discharge: 2015-05-19 | Disposition: A | Discharge status: Home / Self Care | Payer: Managed Care, Other (non HMO) | Source: Ambulatory Visit | Attending: Obstetrics and Gynecology | Admitting: Obstetrics and Gynecology

## 2015-05-19 DIAGNOSIS — Z029 Encounter for administrative examinations, unspecified: Secondary | ICD-10-CM | POA: Diagnosis not present

## 2015-05-19 MED ORDER — HYDROXYPROGESTERONE CAPROATE 250 MG/ML IM OIL
250.0000 mg | TOPICAL_OIL | Freq: Once | INTRAMUSCULAR | Status: AC
  Administered 2015-05-19: 250 mg via INTRAMUSCULAR
  Filled 2015-05-19: qty 1

## 2015-05-19 NOTE — MAU Note (Signed)
Pt here for 17P injection, medication was delivered to her house today, by that time MD office was closed.

## 2015-08-11 ENCOUNTER — Inpatient Hospital Stay (HOSPITAL_COMMUNITY): Payer: Managed Care, Other (non HMO) | Admitting: Anesthesiology

## 2015-08-11 ENCOUNTER — Encounter (HOSPITAL_COMMUNITY): Payer: Self-pay | Admitting: *Deleted

## 2015-08-11 ENCOUNTER — Encounter (HOSPITAL_COMMUNITY): Payer: Self-pay | Admitting: Anesthesiology

## 2015-08-11 ENCOUNTER — Inpatient Hospital Stay (HOSPITAL_COMMUNITY): Payer: Managed Care, Other (non HMO)

## 2015-08-11 ENCOUNTER — Encounter (HOSPITAL_COMMUNITY)
Admission: AD | Disposition: A | Discharge status: Home / Self Care | Payer: Self-pay | Source: Ambulatory Visit | Attending: Obstetrics and Gynecology

## 2015-08-11 ENCOUNTER — Inpatient Hospital Stay (HOSPITAL_COMMUNITY)
Admission: AD | Admit: 2015-08-11 | Discharge: 2015-08-13 | DRG: 766 | Disposition: A | Discharge status: Home / Self Care | Payer: Managed Care, Other (non HMO) | Source: Ambulatory Visit | Attending: Obstetrics and Gynecology | Admitting: Obstetrics and Gynecology

## 2015-08-11 DIAGNOSIS — O321XX0 Maternal care for breech presentation, not applicable or unspecified: Secondary | ICD-10-CM | POA: Diagnosis present

## 2015-08-11 DIAGNOSIS — Z3A37 37 weeks gestation of pregnancy: Secondary | ICD-10-CM

## 2015-08-11 DIAGNOSIS — Z3490 Encounter for supervision of normal pregnancy, unspecified, unspecified trimester: Secondary | ICD-10-CM

## 2015-08-11 DIAGNOSIS — Z3689 Encounter for other specified antenatal screening: Secondary | ICD-10-CM

## 2015-08-11 DIAGNOSIS — Z3A36 36 weeks gestation of pregnancy: Secondary | ICD-10-CM

## 2015-08-11 LAB — CBC
HCT: 39.1 % (ref 36.0–46.0)
HEMOGLOBIN: 13.5 g/dL (ref 12.0–15.0)
MCH: 29.1 pg (ref 26.0–34.0)
MCHC: 34.5 g/dL (ref 30.0–36.0)
MCV: 84.3 fL (ref 78.0–100.0)
Platelets: 192 10*3/uL (ref 150–400)
RBC: 4.64 MIL/uL (ref 3.87–5.11)
RDW: 14.1 % (ref 11.5–15.5)
WBC: 17.9 10*3/uL — ABNORMAL HIGH (ref 4.0–10.5)

## 2015-08-11 LAB — TYPE AND SCREEN
ABO/RH(D): A POS
ANTIBODY SCREEN: NEGATIVE

## 2015-08-11 LAB — ABO/RH: ABO/RH(D): A POS

## 2015-08-11 SURGERY — Surgical Case
Anesthesia: Spinal

## 2015-08-11 MED ORDER — MORPHINE SULFATE (PF) 0.5 MG/ML IJ SOLN
INTRAMUSCULAR | Status: AC
  Filled 2015-08-11: qty 10

## 2015-08-11 MED ORDER — PHENYLEPHRINE 8 MG IN D5W 100 ML (0.08MG/ML) PREMIX OPTIME
INJECTION | INTRAVENOUS | Status: DC | PRN
  Administered 2015-08-11: 60 ug/min via INTRAVENOUS

## 2015-08-11 MED ORDER — ONDANSETRON HCL 4 MG/2ML IJ SOLN
INTRAMUSCULAR | Status: DC | PRN
  Administered 2015-08-11: 4 mg via INTRAVENOUS

## 2015-08-11 MED ORDER — LANOLIN HYDROUS EX OINT: 1.0000 "application " | TOPICAL_OINTMENT | CUTANEOUS | Status: DC | PRN

## 2015-08-11 MED ORDER — SCOPOLAMINE 1 MG/3DAYS TD PT72: 1.0000 | MEDICATED_PATCH | Freq: Once | TRANSDERMAL | Status: DC

## 2015-08-11 MED ORDER — LACTATED RINGERS IV SOLN
INTRAVENOUS | Status: DC | PRN
  Administered 2015-08-11 (×2): via INTRAVENOUS

## 2015-08-11 MED ORDER — PRENATAL MULTIVITAMIN CH
1.0000 | ORAL_TABLET | Freq: Every day | ORAL | Status: DC
  Administered 2015-08-12: 1 via ORAL
  Filled 2015-08-11: qty 1

## 2015-08-11 MED ORDER — MENTHOL 3 MG MT LOZG: 1.0000 | LOZENGE | OROMUCOSAL | Status: DC | PRN

## 2015-08-11 MED ORDER — LACTATED RINGERS IV SOLN
40.0000 [IU] | INTRAVENOUS | Status: DC | PRN
  Administered 2015-08-11: 40 [IU] via INTRAVENOUS

## 2015-08-11 MED ORDER — KETOROLAC TROMETHAMINE 30 MG/ML IJ SOLN
INTRAMUSCULAR | Status: AC
  Filled 2015-08-11: qty 1

## 2015-08-11 MED ORDER — ZOLPIDEM TARTRATE 5 MG PO TABS: 5.0000 mg | ORAL_TABLET | Freq: Every evening | ORAL | Status: DC | PRN

## 2015-08-11 MED ORDER — SODIUM CHLORIDE 0.9% FLUSH: 3.0000 mL | INTRAVENOUS | Status: DC | PRN

## 2015-08-11 MED ORDER — LACTATED RINGERS IV SOLN
INTRAVENOUS | Status: DC
  Administered 2015-08-12: 01:00:00 via INTRAVENOUS

## 2015-08-11 MED ORDER — LACTATED RINGERS IV BOLUS (SEPSIS): 1000.0000 mL | Freq: Once | INTRAVENOUS | Status: DC

## 2015-08-11 MED ORDER — BISACODYL 10 MG RE SUPP: 10.0000 mg | Freq: Every day | RECTAL | Status: DC | PRN

## 2015-08-11 MED ORDER — WITCH HAZEL-GLYCERIN EX PADS: 1.0000 "application " | MEDICATED_PAD | CUTANEOUS | Status: DC | PRN

## 2015-08-11 MED ORDER — NALBUPHINE HCL 10 MG/ML IJ SOLN
INTRAMUSCULAR | Status: AC
  Filled 2015-08-11: qty 1

## 2015-08-11 MED ORDER — DEXAMETHASONE SODIUM PHOSPHATE 4 MG/ML IJ SOLN
INTRAMUSCULAR | Status: DC | PRN
  Administered 2015-08-11: 4 mg via INTRAVENOUS

## 2015-08-11 MED ORDER — CEFAZOLIN SODIUM-DEXTROSE 2-3 GM-% IV SOLR
2.0000 g | INTRAVENOUS | Status: AC
  Administered 2015-08-11: 2 g via INTRAVENOUS
  Filled 2015-08-11: qty 50

## 2015-08-11 MED ORDER — BUPIVACAINE IN DEXTROSE 0.75-8.25 % IT SOLN
INTRATHECAL | Status: DC | PRN
Start: 2015-08-11 — End: 2015-08-11
  Administered 2015-08-11: 1.2 mL via INTRATHECAL

## 2015-08-11 MED ORDER — OXYCODONE-ACETAMINOPHEN 5-325 MG PO TABS
1.0000 | ORAL_TABLET | ORAL | Status: DC | PRN
  Administered 2015-08-12 – 2015-08-13 (×2): 1 via ORAL
  Filled 2015-08-11 (×2): qty 1

## 2015-08-11 MED ORDER — ONDANSETRON HCL 4 MG/2ML IJ SOLN
INTRAMUSCULAR | Status: AC
  Filled 2015-08-11: qty 2

## 2015-08-11 MED ORDER — SIMETHICONE 80 MG PO CHEW
80.0000 mg | CHEWABLE_TABLET | ORAL | Status: DC
  Administered 2015-08-12 – 2015-08-13 (×2): 80 mg via ORAL
  Filled 2015-08-11 (×2): qty 1

## 2015-08-11 MED ORDER — SCOPOLAMINE 1 MG/3DAYS TD PT72
MEDICATED_PATCH | TRANSDERMAL | Status: DC | PRN
  Administered 2015-08-11: 1 via TRANSDERMAL

## 2015-08-11 MED ORDER — SIMETHICONE 80 MG PO CHEW
80.0000 mg | CHEWABLE_TABLET | Freq: Three times a day (TID) | ORAL | Status: DC
  Administered 2015-08-11 – 2015-08-13 (×5): 80 mg via ORAL
  Filled 2015-08-11 (×6): qty 1

## 2015-08-11 MED ORDER — TETANUS-DIPHTH-ACELL PERTUSSIS 5-2.5-18.5 LF-MCG/0.5 IM SUSP: 0.5000 mL | Freq: Once | INTRAMUSCULAR | Status: DC

## 2015-08-11 MED ORDER — OXYTOCIN 10 UNIT/ML IJ SOLN
INTRAMUSCULAR | Status: AC
  Filled 2015-08-11: qty 4

## 2015-08-11 MED ORDER — MEPERIDINE HCL 25 MG/ML IJ SOLN
INTRAMUSCULAR | Status: AC
  Filled 2015-08-11: qty 1

## 2015-08-11 MED ORDER — MEASLES, MUMPS & RUBELLA VAC ~~LOC~~ INJ: 0.5000 mL | INJECTION | Freq: Once | SUBCUTANEOUS | Status: DC

## 2015-08-11 MED ORDER — DIPHENHYDRAMINE HCL 25 MG PO CAPS: 25.0000 mg | ORAL_CAPSULE | Freq: Four times a day (QID) | ORAL | Status: DC | PRN

## 2015-08-11 MED ORDER — IBUPROFEN 800 MG PO TABS
800.0000 mg | ORAL_TABLET | Freq: Three times a day (TID) | ORAL | Status: DC | PRN
  Administered 2015-08-11 – 2015-08-13 (×5): 800 mg via ORAL
  Filled 2015-08-11 (×5): qty 1

## 2015-08-11 MED ORDER — MEPERIDINE HCL 25 MG/ML IJ SOLN: 6.2500 mg | INTRAMUSCULAR | Status: DC | PRN

## 2015-08-11 MED ORDER — SODIUM CHLORIDE 0.9 % IV SOLN: 250.0000 mL | INTRAVENOUS | Status: DC

## 2015-08-11 MED ORDER — SIMETHICONE 80 MG PO CHEW: 80.0000 mg | CHEWABLE_TABLET | ORAL | Status: DC | PRN

## 2015-08-11 MED ORDER — SODIUM BICARBONATE 8.4 % IV SOLN
INTRAVENOUS | Status: DC | PRN
  Administered 2015-08-11: 1 mL via EPIDURAL

## 2015-08-11 MED ORDER — NALBUPHINE HCL 10 MG/ML IJ SOLN: 5.0000 mg | Freq: Once | INTRAMUSCULAR | Status: DC | PRN

## 2015-08-11 MED ORDER — PHENYLEPHRINE 8 MG IN D5W 100 ML (0.08MG/ML) PREMIX OPTIME
INJECTION | INTRAVENOUS | Status: AC
  Filled 2015-08-11: qty 100

## 2015-08-11 MED ORDER — HYDROMORPHONE HCL 1 MG/ML IJ SOLN: 0.2500 mg | INTRAMUSCULAR | Status: DC | PRN

## 2015-08-11 MED ORDER — CITRIC ACID-SODIUM CITRATE 334-500 MG/5ML PO SOLN
30.0000 mL | Freq: Once | ORAL | Status: AC
  Administered 2015-08-11: 30 mL via ORAL
  Filled 2015-08-11: qty 15

## 2015-08-11 MED ORDER — FLEET ENEMA 7-19 GM/118ML RE ENEM: 1.0000 | ENEMA | Freq: Every day | RECTAL | Status: DC | PRN

## 2015-08-11 MED ORDER — FENTANYL CITRATE (PF) 100 MCG/2ML IJ SOLN
INTRAMUSCULAR | Status: AC
  Filled 2015-08-11: qty 2

## 2015-08-11 MED ORDER — SODIUM CHLORIDE 0.9% FLUSH: 3.0000 mL | Freq: Two times a day (BID) | INTRAVENOUS | Status: DC

## 2015-08-11 MED ORDER — FENTANYL CITRATE (PF) 100 MCG/2ML IJ SOLN
INTRAMUSCULAR | Status: DC | PRN
  Administered 2015-08-11: 25 ug via INTRAVENOUS

## 2015-08-11 MED ORDER — LACTATED RINGERS IV SOLN: 2.5000 [IU]/h | INTRAVENOUS | Status: AC

## 2015-08-11 MED ORDER — NALBUPHINE HCL 10 MG/ML IJ SOLN: 5.0000 mg | INTRAMUSCULAR | Status: DC | PRN

## 2015-08-11 MED ORDER — DIBUCAINE 1 % RE OINT: 1.0000 "application " | TOPICAL_OINTMENT | RECTAL | Status: DC | PRN

## 2015-08-11 MED ORDER — OXYCODONE-ACETAMINOPHEN 5-325 MG PO TABS
2.0000 | ORAL_TABLET | ORAL | Status: DC | PRN
  Administered 2015-08-12: 2 via ORAL
  Filled 2015-08-11: qty 2

## 2015-08-11 MED ORDER — ACETAMINOPHEN 500 MG PO TABS
1000.0000 mg | ORAL_TABLET | Freq: Four times a day (QID) | ORAL | Status: AC
  Administered 2015-08-11 – 2015-08-12 (×3): 1000 mg via ORAL
  Filled 2015-08-11 (×3): qty 2

## 2015-08-11 MED ORDER — LACTATED RINGERS IV SOLN
INTRAVENOUS | Status: DC
  Administered 2015-08-11 (×2): via INTRAVENOUS

## 2015-08-11 MED ORDER — FAMOTIDINE IN NACL 20-0.9 MG/50ML-% IV SOLN
20.0000 mg | Freq: Once | INTRAVENOUS | Status: AC
  Administered 2015-08-11: 20 mg via INTRAVENOUS
  Filled 2015-08-11: qty 50

## 2015-08-11 MED ORDER — MORPHINE SULFATE (PF) 0.5 MG/ML IJ SOLN
INTRAMUSCULAR | Status: DC | PRN
  Administered 2015-08-11: .1 mg via EPIDURAL

## 2015-08-11 MED ORDER — MEPERIDINE HCL 25 MG/ML IJ SOLN
INTRAMUSCULAR | Status: DC | PRN
  Administered 2015-08-11: 12.5 mg via INTRAVENOUS

## 2015-08-11 MED ORDER — DEXAMETHASONE SODIUM PHOSPHATE 4 MG/ML IJ SOLN
INTRAMUSCULAR | Status: AC
  Filled 2015-08-11: qty 1

## 2015-08-11 MED ORDER — NALOXONE HCL 0.4 MG/ML IJ SOLN: 0.4000 mg | INTRAMUSCULAR | Status: DC | PRN

## 2015-08-11 MED ORDER — ACETAMINOPHEN 325 MG PO TABS: 650.0000 mg | ORAL_TABLET | ORAL | Status: DC | PRN

## 2015-08-11 MED ORDER — NALOXONE HCL 2 MG/2ML IJ SOSY: 1.0000 ug/kg/h | PREFILLED_SYRINGE | INTRAVENOUS | Status: DC | PRN

## 2015-08-11 MED ORDER — SENNOSIDES-DOCUSATE SODIUM 8.6-50 MG PO TABS
2.0000 | ORAL_TABLET | ORAL | Status: DC
  Administered 2015-08-12 – 2015-08-13 (×2): 2 via ORAL
  Filled 2015-08-11 (×2): qty 2

## 2015-08-11 MED ORDER — KETOROLAC TROMETHAMINE 30 MG/ML IJ SOLN
30.0000 mg | Freq: Once | INTRAMUSCULAR | Status: AC
  Administered 2015-08-11: 30 mg via INTRAMUSCULAR

## 2015-08-11 MED ORDER — SCOPOLAMINE 1 MG/3DAYS TD PT72
MEDICATED_PATCH | TRANSDERMAL | Status: AC
  Filled 2015-08-11: qty 1

## 2015-08-11 MED ORDER — ONDANSETRON HCL 4 MG/2ML IJ SOLN: 4.0000 mg | Freq: Three times a day (TID) | INTRAMUSCULAR | Status: DC | PRN

## 2015-08-11 SURGICAL SUPPLY — 25 items
CLAMP CORD UMBIL (MISCELLANEOUS) IMPLANT
CLOTH BEACON ORANGE TIMEOUT ST (SAFETY) ×2 IMPLANT
DRSG OPSITE POSTOP 4X10 (GAUZE/BANDAGES/DRESSINGS) ×2 IMPLANT
DURAPREP 26ML APPLICATOR (WOUND CARE) ×2 IMPLANT
ELECT REM PT RETURN 9FT ADLT (ELECTROSURGICAL) ×2
ELECTRODE REM PT RTRN 9FT ADLT (ELECTROSURGICAL) ×1 IMPLANT
EXTRACTOR VACUUM M CUP 4 TUBE (SUCTIONS) IMPLANT
GLOVE BIO SURGEON STRL SZ7 (GLOVE) ×2 IMPLANT
GLOVE BIOGEL PI IND STRL 7.0 (GLOVE) ×1 IMPLANT
GLOVE BIOGEL PI INDICATOR 7.0 (GLOVE) ×1
GOWN STRL REUS W/TWL LRG LVL3 (GOWN DISPOSABLE) ×4 IMPLANT
KIT ABG SYR 3ML LUER SLIP (SYRINGE) IMPLANT
NEEDLE HYPO 25X5/8 SAFETYGLIDE (NEEDLE) ×2 IMPLANT
NS IRRIG 1000ML POUR BTL (IV SOLUTION) ×2 IMPLANT
PACK C SECTION WH (CUSTOM PROCEDURE TRAY) ×2 IMPLANT
PAD OB MATERNITY 4.3X12.25 (PERSONAL CARE ITEMS) ×2 IMPLANT
PENCIL SMOKE EVAC W/HOLSTER (ELECTROSURGICAL) ×2 IMPLANT
STRIP CLOSURE SKIN 1/2X4 (GAUZE/BANDAGES/DRESSINGS) ×2 IMPLANT
SUT CHROMIC 0 CTX 36 (SUTURE) ×6 IMPLANT
SUT MON AB 4-0 PS1 27 (SUTURE) ×2 IMPLANT
SUT PDS AB 0 CT1 27 (SUTURE) ×4 IMPLANT
SUT VIC AB 3-0 CT1 27 (SUTURE) ×2
SUT VIC AB 3-0 CT1 TAPERPNT 27 (SUTURE) ×2 IMPLANT
TOWEL OR 17X24 6PK STRL BLUE (TOWEL DISPOSABLE) ×2 IMPLANT
TRAY FOLEY CATH SILVER 14FR (SET/KITS/TRAYS/PACK) ×2 IMPLANT

## 2015-08-11 NOTE — Progress Notes (Signed)
CS for breech, proced + risks reviewd

## 2015-08-11 NOTE — Anesthesia Postprocedure Evaluation (Signed)
Anesthesia Post Note  Patient: Rebecca Richmond  Procedure(s) Performed: Procedure(s) (LRB): CESAREAN SECTION (N/A)  Patient location during evaluation: PACU Anesthesia Type: Spinal Level of consciousness: awake Pain management: satisfactory to patient Vital Signs Assessment: post-procedure vital signs reviewed and stable Respiratory status: spontaneous breathing Cardiovascular status: blood pressure returned to baseline Postop Assessment: no headache and spinal receding Anesthetic complications: no    Last Vitals:  Filed Vitals:   08/11/15 1430 08/11/15 1530  BP: 114/56 104/58  Pulse: 70 72  Temp: 36.9 C 36.6 C  Resp: 16 16    Last Pain:  Filed Vitals:   08/11/15 1614  PainSc: 1                  Tayte Mcwherter EDWARD

## 2015-08-11 NOTE — Progress Notes (Signed)
Pt up to bathroom for pericare. Moderate amount of bleeding noted during care. Pt assisted back to bed. Fundal message firm and 1 below. Mild trickle noted then stopped with message. Pt denies dizziness. HR tachycardic but BP WNL. Will continue to monitor

## 2015-08-11 NOTE — MAU Note (Signed)
Pt reports contractions since 2 am, getting stronger. Denies bleeding or ROM

## 2015-08-11 NOTE — Anesthesia Preprocedure Evaluation (Signed)

## 2015-08-11 NOTE — Anesthesia Procedure Notes (Signed)
Spinal Patient location during procedure: OR Preanesthetic Checklist Completed: patient identified, site marked, surgical consent, pre-op evaluation, timeout performed, IV checked, risks and benefits discussed and monitors and equipment checked Spinal Block Patient position: sitting Prep: DuraPrep Patient monitoring: heart rate, cardiac monitor, continuous pulse ox and blood pressure Approach: midline Location: L3-4 Injection technique: single-shot Needle Needle type: Sprotte  Needle gauge: 24 G Needle length: 9 cm Assessment Sensory level: T4 Additional Notes Spinal Dosage in OR  Bupivicaine ml       1.2 PFMS04   mcg        100 Fentanyl mcg            25    

## 2015-08-11 NOTE — Progress Notes (Signed)
Pt out of PACU to rm 148

## 2015-08-11 NOTE — Transfer of Care (Signed)
Immediate Anesthesia Transfer of Care Note  Patient: Rebecca Richmond  Procedure(s) Performed: Procedure(s): CESAREAN SECTION (N/A)  Patient Location: PACU  Anesthesia Type:Spinal  Level of Consciousness: awake, alert  and oriented  Airway & Oxygen Therapy: Patient Spontanous Breathing  Post-op Assessment: Report given to RN and Post -op Vital signs reviewed and stable  Post vital signs: Reviewed and stable  Last Vitals:  Filed Vitals:   08/11/15 0919 08/11/15 0951  BP: 139/90 137/90  Pulse: 108 106  Temp:  36.7 C  Resp:  16    Complications: No apparent anesthesia complications

## 2015-08-11 NOTE — H&P (Signed)
[facility]  DICTATION # [id number] CSN# [id number]   Meriel PicaHOLLAND,Jentry Mcqueary M, MD 08/11/2015 10:08 AM

## 2015-08-11 NOTE — Op Note (Signed)
Preoperative diagnosis:6837w4d pregnancy, active labor, breech presentation  Postoperative diagnosis: Same  Procedure: Primary low transverse cesarean section  Surgeon: Marcelle OverlieHolland  Anesthesia: Spinal  EBL: 600 cc  Procedure and findings:  The patient taken the operating room after an adequate level of spinal anesthetic was obtained with patient in left tilt position the abdomen perineum and vagina were prepped and draped Foley catheter positioned. Appropriate timeouts were taken at that point. Prepping and draping, transfers transverse incision was made carried down the fascia which was incised and extended transversely. Rectus muscles divided in the midline, peritoneum entered superiorly without incident and extended in a vertical fashion. The vesicouterine serosa was incised and the bladder was bluntly and sharply dissected below, bladder blade was repositioned. Transverse incision made lower segment extended with blunt dissection clear fluid was then noted the patient delivered of a healthy infant from the frank breech presentation, the infant was suctioned, cord clamped and passed the pediatric team for further care. Placenta was then delivered manually intact, uterus exteriorized, cavity wiped clean with laparotomy pack closure obtained the first layer of 0 chromic in a locked fashion followed by an imbricating layer of 0 chromic. This is hemostatic bladder flap area was intact and hemostatic bilateral tubes and ovaries were normal. Prior to closure sponge, needle, history counts reported as correct 2. Peritoneum closed with 3-0 Vicryl suture, the same for the rectus muscles in the midline. 0 PDS was then used from laterally to midline and close the fascia subcutaneous tissue was hemostatic and was fairly thin, 4-0 Monocryl subcuticular closure with a honeycomb dressing she tolerated this well went to recovery room in good condition.  Dictated with [facility]  Jullian Previti Milana ObeyM Izel Hochberg M.D.

## 2015-08-11 NOTE — H&P (Signed)
NAMGarald Braver:  Recendiz, Rebecca Richmond              ACCOUNT NO.:  [id number]  MEDICAL RECORD NO.:  [id number]  LOCATION:  [id number]                          FACILITY:  [facility]  PHYSICIAN:  Duke Salviaichard M. Marcelle OverlieHolland, M.D.DATE OF BIRTH:  Apr 14, 1986  DATE OF ADMISSION:  08/11/2015 DATE OF DISCHARGE:                             HISTORY & PHYSICAL   CHIEF COMPLAINT:  Breech presentation in labor, 37 weeks.  HISTORY OF PRESENT ILLNESS:  A 30 year old, G2, P-0-1-0-1.  The patient has been on weekly progesterone for history of preterm labor, presents to [facility] with known breech presentation in labor.  She changed from 2-4 cm with a bulging bag, confirmed breech by bedside ultrasound.  GBS negative.  Procedure of C-section including specific risks related to bleeding, infection, wound infection, phlebitis, transfusion discussed with her, which she understands and accepts.  Blood type is A positive.  PAST MEDICAL HISTORY:  Vaginal delivery in 2013; 5 pounds 11 ounces at 34-1/2 weeks.  Please see [place] form for family and social history.  PHYSICAL EXAMINATION:  VITAL SIGNS:  Temp 98.2, blood pressure 120/78. HEENT:  Unremarkable. NECK:  Supple without masses. LUNGS:  Clear. CARDIOVASCULAR:  Regular rate and rhythm without murmurs, rubs, gallops. BREASTS:  Not examined. Fetal heart rate 148, 35 cm fundal height.  Cervix was 4.  Breech, bulging membranes. EXTREMITIES:  Unremarkable. NEUROLOGIC:  Unremarkable.  IMPRESSION:  Breech presentation, 37-weeks, active labor.  PLAN:  Primary cesarean section.  Procedure and risks discussed as above.     Kordel Leavy M. Marcelle OverlieHolland, M.D.     [facility]/MEDQ  D:  08/11/2015  T:  08/11/2015  Job:  [id number]

## 2015-08-12 ENCOUNTER — Encounter (HOSPITAL_COMMUNITY): Payer: Self-pay | Admitting: *Deleted

## 2015-08-12 LAB — CBC
HEMATOCRIT: 31.9 % — AB (ref 36.0–46.0)
Hemoglobin: 10.4 g/dL — ABNORMAL LOW (ref 12.0–15.0)
MCH: 27.7 pg (ref 26.0–34.0)
MCHC: 32.6 g/dL (ref 30.0–36.0)
MCV: 85.1 fL (ref 78.0–100.0)
Platelets: 152 10*3/uL (ref 150–400)
RBC: 3.75 MIL/uL — AB (ref 3.87–5.11)
RDW: 14.4 % (ref 11.5–15.5)
WBC: 13.4 10*3/uL — AB (ref 4.0–10.5)

## 2015-08-12 LAB — RPR: RPR Ser Ql: NONREACTIVE

## 2015-08-12 NOTE — Lactation Note (Signed)
This note was copied from a baby's chart. Lactation Consultation Note  Patient Name: Rebecca Richmond Reason for consult: Initial assessment;Late preterm infant   Initial consult with exp BF mom of 23 hour old infant born at 3836 w 4 d GA. Infant with 7 BF for 10-30 minutes, 2 attempts, 3 EBM Supplementations of 3-5 cc, 2 voids and 3 stools in last 24 hours. Infant weight 6 lb 4 oz with 2% weigh loss since birth.   Mom feels infant has been feeding well and is now sleepy with this feeding. Discussed LPT infant behavior with parents.   Parents were trying to awaken infant to feed for the last hour. Reviewed awakening techniques with parents. With stimulation, Infant did awaken and latched to right breast with flanged lips, rhythmic sucking and intermittent swallows. Set up 5 fr feeding tube at breast for infant to obtain supplement while at breast. Infant did get into more rhythmic sucking pattern when supplement was added at breast. Infant fed for 15 minutes and took 4 cc EBM through feeding tube while at breast. SNS used to give infant supplement while stimulating breast. Mom agreeable.  Mom is pumping some and has received EBM and has dad has supplemented infant with curved tip syringe while at breast.   Reviewed LPT infant feeding policy with parents. Enc parents to give all EBM to infant using SNS or finger feeding post BF the remainder of supplement. Discussed need to increase supplement to infant to 10-20 cc / feeding, especially if infant not willing to feed at breast.   Plan: BF infant 8-12 x in 24 hours at first feeding cues, use 5 FR feeding tube at breast with any EBM added Awaken infant at 3 hours if she is not cueing to feed to get her to feed Awaken infant while at breast as needed If does not take supplement at breast, finger feed infant post BF Pump for 15 minutes on Initiate Setting with DEBP after BF infant Store EBM via storage guidelines Call for  assistance as needed prn Will follow up tomorrow.      Maternal Data Formula Feeding for Exclusion: No Has patient been taught Hand Expression?: Yes Does the patient have breastfeeding experience prior to this delivery?: Yes  Feeding Feeding Type: Breast Fed Length of feed: 15 min  LATCH Score/Interventions Latch: Grasps breast easily, tongue down, lips flanged, rhythmical sucking. Intervention(s): Adjust position;Assist with latch;Breast massage;Breast compression  Audible Swallowing: Spontaneous and intermittent (With EBM in 5 fr feedig tube at breast) Intervention(s): Skin to skin;Hand expression;Alternate breast massage  Type of Nipple: Everted at rest and after stimulation  Comfort (Breast/Nipple): Soft / non-tender     Hold (Positioning): Assistance needed to correctly position infant at breast and maintain latch. Intervention(s): Breastfeeding basics reviewed;Support Pillows;Position options;Skin to skin  LATCH Score: 9  Lactation Tools Discussed/Used Tools: 71F feeding tube / Syringe Pump Review: Setup, frequency, and cleaning;Milk Storage   Consult Status Consult Status: Follow-up Date: 08/13/15 Follow-up type: In-patient    [name], 11:56 AM

## 2015-08-12 NOTE — Progress Notes (Signed)
Subjective: Postpartum Day 1: Cesarean Delivery Patient reports tolerating PO.    Objective: Vital signs in last 24 hours: Temp:  [97.6 F (36.4 C)-98.4 F (36.9 C)] 98.3 F (36.8 C) (03/18 0434) Pulse Rate:  [68-138] 73 (03/18 0434) Resp:  [13-22] 16 (03/18 0434) BP: (90-151)/(45-131) 90/45 mmHg (03/18 0434) SpO2:  [94 %-99 %] 95 % (03/18 0434)  Physical Exam:  General: alert Lochia: appropriate Uterine Fundus: firm Incision: healing well DVT Evaluation: No evidence of DVT seen on physical exam.   Recent Labs  08/11/15 1024 08/12/15 0553  HGB 13.5 10.4*  HCT 39.1 31.9*    Assessment/Plan: Status post Cesarean section. Doing well postoperatively.  Continue current care.  Meriel PicaHOLLAND,Elbert Polyakov M 08/12/2015, 8:49 AM

## 2015-08-12 NOTE — Addendum Note (Signed)
Addendum  created 08/12/15 0949 by Collier FlowersElizabeth J Emmaline Wahba, CRNA   Modules edited: Clinical Notes   Clinical Notes:  File: [id number]

## 2015-08-12 NOTE — Anesthesia Postprocedure Evaluation (Signed)
Anesthesia Post Note  Patient: Rebecca Richmond  Procedure(s) Performed: Procedure(s) (LRB): CESAREAN SECTION (N/A)  Patient location during evaluation: Mother Baby Anesthesia Type: Spinal Level of consciousness: awake and alert, oriented and patient cooperative Pain management: pain level controlled Vital Signs Assessment: post-procedure vital signs reviewed and stable Respiratory status: spontaneous breathing and nonlabored ventilation Cardiovascular status: blood pressure returned to baseline Postop Assessment: no headache, no backache, no signs of nausea or vomiting and adequate PO intake Anesthetic complications: no Comments: Pain level 3, educated regarding pain medication facilitating recovery and mobilization    Last Vitals:  Filed Vitals:   08/12/15 0044 08/12/15 0434  BP: 94/53 90/45  Pulse: 72 73  Temp: 36.8 C 36.8 C  Resp: 16 16    Last Pain:  Filed Vitals:   08/12/15 0434  PainSc: 1                  Trinnity Breunig

## 2015-08-12 NOTE — Progress Notes (Signed)
Pt up to bathroom to remove catheter. Moderate trickle of blood while sitting on toilet. Fundus firm and midline, no clots. Walked back to bed. Fundus reassess and remains firm, trickle ceased

## 2015-08-13 MED ORDER — OXYCODONE-ACETAMINOPHEN 5-325 MG PO TABS: 2.0000 | ORAL_TABLET | ORAL | Status: AC | PRN

## 2015-08-13 MED ORDER — PRENATAL MULTIVITAMIN CH: 1.0000 | ORAL_TABLET | Freq: Every day | ORAL | Status: AC

## 2015-08-13 MED ORDER — IBUPROFEN 800 MG PO TABS
800.0000 mg | ORAL_TABLET | Freq: Three times a day (TID) | ORAL | Status: AC | PRN
Start: 2015-08-13 — End: ?

## 2015-08-13 NOTE — Lactation Note (Signed)
This note was copied from a baby's chart. Lactation Consultation Note  Baby latched in cross cradle position with 5 fr feeding tube.  Reviewed how to tape tubing to breast since parents state tubing keeps coming out of baby's mouth during feeding. Baby breastfed off and on for 40 min but only took 5ml of formula from SNS. Had parents burp her half way through feeding to wake baby. Suggest once parents get home since she is not taking a lot of volume at the breast, they can start supplementing with slow flow bottle. Continue breastfeeding first and then supplement after. Reviewed volume guidelines which should be 10-20 ml.  Mother pumped 10ml last night but has not pumped since. Encouraged her to post pump 10-15 min at least 4-6 times a day.  Give baby back volume pumped and the difference with formula if tolerated. Reviewed engorgement care and monitoring voids/stools. OP appointment 3/27 1:30p Provided parents with another 5 fr feeding set.  Patient Name: Girl Christel MormonHayley Utz RUEAV'WToday's Date: 08/13/2015 Reason for consult: Follow-up assessment   Maternal Data    Feeding Feeding Type: Breast Fed Length of feed: 40 min  LATCH Score/Interventions Latch: Grasps breast easily, tongue down, lips flanged, rhythmical sucking.  Audible Swallowing: A few with stimulation  Type of Nipple: Everted at rest and after stimulation  Comfort (Breast/Nipple): Soft / non-tender     Hold (Positioning): No assistance needed to correctly position infant at breast.  LATCH Score: 9  Lactation Tools Discussed/Used Tools: 38F feeding tube / Syringe   Consult Status Consult Status: Follow-up Date: 08/21/15 Follow-up type: Out-patient    Dahlia ByesBerkelhammer, Ruth [facility] 08/13/2015, 10:21 AM

## 2015-08-13 NOTE — Discharge Summary (Signed)
Obstetric Discharge Summary Reason for Admission: onset of labor Prenatal Procedures: none Intrapartum Procedures: cesarean: low cervical, transverse Postpartum Procedures: none Complications-Operative and Postpartum: none HEMOGLOBIN  Date Value Ref Range Status  08/12/2015 10.4* 12.0 - 15.0 g/dL Final    Comment:    REPEATED TO VERIFY DELTA CHECK NOTED    HCT  Date Value Ref Range Status  08/12/2015 31.9* 36.0 - 46.0 % Final    Physical Exam:  General: alert Lochia: appropriate Uterine Fundus: firm Incision: healing well DVT Evaluation: No evidence of DVT seen on physical exam.  Discharge Diagnoses: 37 week IUP, labor, BREECH, LTCS  Discharge Information: Date: 08/13/2015 Activity: pelvic rest Diet: routine Medications: PNV, Ibuprofen and Percocet Condition: stable Instructions: refer to practice specific booklet Discharge to: home Follow-up Information    Follow up with Meriel PicaHOLLAND,Nicholaus Steinke M, MD. Schedule an appointment as soon as possible for a visit in 1 week.   Specialty:  Obstetrics and Gynecology   Contact information:   [address] [place] [address] [phone]       Newborn Data: Live born female  Birth Weight: 6 lb 6.3 oz (2900 g) APGAR: ,   Home with mother.  Meriel PicaHOLLAND,Vernica Wachtel M 08/13/2015, 8:31 AM

## 2015-08-14 ENCOUNTER — Encounter (HOSPITAL_COMMUNITY): Payer: Self-pay | Admitting: *Deleted

## 2015-08-14 ENCOUNTER — Telehealth (HOSPITAL_COMMUNITY): Payer: Self-pay | Admitting: Lactation Services

## 2015-08-14 NOTE — Telephone Encounter (Signed)
Telephone call- LC called mom back with Breast feeding questions ( several )  1) breast lump under armpit , size of 1/2 dollar.  LC response - normal - called an accessory gland - mom denies engorgement just fullness.  And is post pumping after baby feeds one side.  LC recommended applying bag of frozen vegetables in her arm pit before feeding or pumping or cold cabbage leave.  Due to the baby being  A 36 4/7 weeks infant , 6 pounds and probably will only feed on the 1st breast post pumping is indicated 10 -15 mins.  2) - is cramping normal with breast feeding and pumping?  LC response - it is normal and a good sign with feedings and pumping for "Let - down ". Should go away by 72 plus hours. Emptying bladder prior To feeding or pumping will decrease cramping. Also being it is your second baby , and your uterus being stretched cramping is normal.  3) Do I need to supplement with a high calorie formula now that my milk is in.  LC responded - now that your milk is in supplement with your own milk if available. Mom had mentioned she was able to post pump 30 -60 ml.  [facility] Next Monday.  LC stressed the importance of making sue breast aren't over full and if to full to start - hand express off the 1st breast 10 -15 ml so baby will  Get to the creamy fat sooner and weight will increase quicker, feed on the 1st breast 20 mins - supplement afterwards and post pump .  Mom receptive to recommendations.

## 2015-08-21 ENCOUNTER — Ambulatory Visit (HOSPITAL_COMMUNITY)
Admit: 2015-08-21 | Discharge: 2015-08-21 | Disposition: A | Discharge status: Home / Self Care | Payer: Managed Care, Other (non HMO) | Attending: Obstetrics and Gynecology | Admitting: Obstetrics and Gynecology

## 2015-08-21 NOTE — Lactation Note (Addendum)
Lactation Consult for Land O'LakesHayley Richmond (mother) and Evalee JeffersonLacie Richmond Richmond (DOB: [birth year])  Mother's reason for visit:  "lactation consultation" Consult:  Initial Lactation Consultant:  Rebecca Richmond, Rebecca Richmond Hamilton  ________________________________________________________________________ BW: 2900g (6# 6.3oz) D/c weight: 5# 14.6oz 08-15-15: 6# 0 oz Today's weight: 4098J2960g (6# 8.4oz) ________________________________________________________________________  Mother's Name: Rebecca MormonHayley Richmond Type of delivery:  C/S Breastfeeding Experience: 1st baby was in NICU (he didn't latch until 506 wks old) Maternal Medical Conditions:  None Maternal Medications: PNV  ________________________________________________________________________  Breastfeeding History (Post Discharge)  Frequency of breastfeeding: q3-4 Duration of feeding: 10-35 min  Pumping  Type of pump:  Medela pump in style Frequency:  4-6 times/day Volume: 6-10 oz/session  3 bottles/day: 3-3.5oz of EBM   Infant Intake and Output Assessment  Voids: 8  in 24 hrs.  Color:  Clear yellow Stools: 6 in 24 hrs.  Color:  Yellow  ________________________________________________________________________  Maternal Breast Assessment  Breast:  Full Nipple:  Flat initially, but easily become erect  _______________________________________________________________________ Feeding Assessment/Evaluation  Initial feeding assessment:  Infant's oral assessment:  WNL  Attached assessment:  Deep  Lips flanged:  Yes.      Suck assessment:  Displays both  Pre-feed weight: 2960g  Post-feed weight: 3018 g  Amount transferred: 58 ml R breast, cross-cradle hold, 25 min  Pre-feed weight: 3018 g  Post-feed weight: 3064 g  Amount transferred: 46ml L-breast, cross-cradle, 10 min  Total amount transferred: 104 ml  Rebecca BlalockLacie Richmond is a 10-day old newborn who was born at 5056w4d. Today, her CGA is 38w.  She is above BW and she has gained 8oz over the last 6  days. Mom has no complaints.   Rebecca latched w/ease and transferred 3.5oz at the breast. I worked w/Mom on minor latch adjustments and breast compressions when feeding from her R breast (which is her lesser-volume breast).  Mom has an excellent supply & I have no concerns about breastfeeding.   Mom was inquiring about flange size for her pump (she has been using size 30). I observed Mom pump for a few minutes and I recommended that she return to her size 27 flanges for a better fit (unless there is discomfort).   Rebecca does appear jaundiced down through the thighs & there is some significant ruddiness on the chest and parts of the face. I called & spoke w/Rebecca Richmond at Rebecca Richmond to notify her of skin color (but also mentioned excellent weight gain & yellow, seedy BMs). I provided Rebecca NimrodMeredith with Rebecca Richmond's cell # so that she can call her with further directions, if needed.   Rebecca HewKim Malcomb Gangemi, RN, IBCLC

## 2016-07-10 DIAGNOSIS — M25561 Pain in right knee: Secondary | ICD-10-CM | POA: Diagnosis not present

## 2016-07-10 DIAGNOSIS — T8489XA Other specified complication of internal orthopedic prosthetic devices, implants and grafts, initial encounter: Secondary | ICD-10-CM | POA: Diagnosis not present

## 2016-07-19 DIAGNOSIS — T8489XA Other specified complication of internal orthopedic prosthetic devices, implants and grafts, initial encounter: Secondary | ICD-10-CM | POA: Diagnosis not present

## 2016-07-26 DIAGNOSIS — S838X1A Sprain of other specified parts of right knee, initial encounter: Secondary | ICD-10-CM | POA: Diagnosis not present

## 2016-07-26 DIAGNOSIS — S86811A Strain of other muscle(s) and tendon(s) at lower leg level, right leg, initial encounter: Secondary | ICD-10-CM | POA: Diagnosis not present

## 2016-07-26 DIAGNOSIS — M1711 Unilateral primary osteoarthritis, right knee: Secondary | ICD-10-CM | POA: Diagnosis not present

## 2016-08-06 DIAGNOSIS — S838X1D Sprain of other specified parts of right knee, subsequent encounter: Secondary | ICD-10-CM | POA: Diagnosis not present

## 2016-08-13 DIAGNOSIS — S838X1D Sprain of other specified parts of right knee, subsequent encounter: Secondary | ICD-10-CM | POA: Diagnosis not present

## 2016-08-15 DIAGNOSIS — S838X1D Sprain of other specified parts of right knee, subsequent encounter: Secondary | ICD-10-CM | POA: Diagnosis not present

## 2016-08-20 DIAGNOSIS — S838X1D Sprain of other specified parts of right knee, subsequent encounter: Secondary | ICD-10-CM | POA: Diagnosis not present

## 2016-08-22 DIAGNOSIS — S838X1D Sprain of other specified parts of right knee, subsequent encounter: Secondary | ICD-10-CM | POA: Diagnosis not present

## 2016-08-27 DIAGNOSIS — S838X1D Sprain of other specified parts of right knee, subsequent encounter: Secondary | ICD-10-CM | POA: Diagnosis not present

## 2016-08-29 DIAGNOSIS — S838X1D Sprain of other specified parts of right knee, subsequent encounter: Secondary | ICD-10-CM | POA: Diagnosis not present

## 2016-09-10 DIAGNOSIS — S838X1D Sprain of other specified parts of right knee, subsequent encounter: Secondary | ICD-10-CM | POA: Diagnosis not present

## 2016-09-13 DIAGNOSIS — M1711 Unilateral primary osteoarthritis, right knee: Secondary | ICD-10-CM | POA: Diagnosis not present

## 2016-09-13 DIAGNOSIS — S86911D Strain of unspecified muscle(s) and tendon(s) at lower leg level, right leg, subsequent encounter: Secondary | ICD-10-CM | POA: Diagnosis not present

## 2016-09-13 DIAGNOSIS — S8001XD Contusion of right knee, subsequent encounter: Secondary | ICD-10-CM | POA: Diagnosis not present

## 2016-11-21 DIAGNOSIS — Z01419 Encounter for gynecological examination (general) (routine) without abnormal findings: Secondary | ICD-10-CM | POA: Diagnosis not present

## 2016-11-21 DIAGNOSIS — Z6829 Body mass index (BMI) 29.0-29.9, adult: Secondary | ICD-10-CM | POA: Diagnosis not present

## 2016-12-19 DIAGNOSIS — N72 Inflammatory disease of cervix uteri: Secondary | ICD-10-CM | POA: Diagnosis not present

## 2016-12-19 DIAGNOSIS — R87810 Cervical high risk human papillomavirus (HPV) DNA test positive: Secondary | ICD-10-CM | POA: Diagnosis not present

## 2016-12-19 DIAGNOSIS — R87612 Low grade squamous intraepithelial lesion on cytologic smear of cervix (LGSIL): Secondary | ICD-10-CM | POA: Diagnosis not present

## 2017-01-15 DIAGNOSIS — J019 Acute sinusitis, unspecified: Secondary | ICD-10-CM | POA: Diagnosis not present

## 2018-01-15 DIAGNOSIS — Z6830 Body mass index (BMI) 30.0-30.9, adult: Secondary | ICD-10-CM | POA: Diagnosis not present

## 2018-01-15 DIAGNOSIS — Z01419 Encounter for gynecological examination (general) (routine) without abnormal findings: Secondary | ICD-10-CM | POA: Diagnosis not present

## 2018-01-15 DIAGNOSIS — R87612 Low grade squamous intraepithelial lesion on cytologic smear of cervix (LGSIL): Secondary | ICD-10-CM | POA: Diagnosis not present

## 2018-02-09 DIAGNOSIS — R87612 Low grade squamous intraepithelial lesion on cytologic smear of cervix (LGSIL): Secondary | ICD-10-CM | POA: Diagnosis not present

## 2018-02-09 DIAGNOSIS — N870 Mild cervical dysplasia: Secondary | ICD-10-CM | POA: Diagnosis not present

## 2018-02-09 DIAGNOSIS — R87810 Cervical high risk human papillomavirus (HPV) DNA test positive: Secondary | ICD-10-CM | POA: Diagnosis not present

## 2018-02-09 DIAGNOSIS — Z3202 Encounter for pregnancy test, result negative: Secondary | ICD-10-CM | POA: Diagnosis not present

## 2018-02-22 ENCOUNTER — Other Ambulatory Visit: Payer: Self-pay

## 2018-02-22 ENCOUNTER — Emergency Department (HOSPITAL_COMMUNITY): Payer: BLUE CROSS/BLUE SHIELD

## 2018-02-22 ENCOUNTER — Encounter (HOSPITAL_COMMUNITY): Payer: Self-pay | Admitting: Emergency Medicine

## 2018-02-22 ENCOUNTER — Emergency Department (HOSPITAL_COMMUNITY)
Admission: EM | Admit: 2018-02-22 | Discharge: 2018-02-22 | Disposition: A | Discharge status: Home / Self Care | Payer: BLUE CROSS/BLUE SHIELD | Attending: Emergency Medicine | Admitting: Emergency Medicine

## 2018-02-22 DIAGNOSIS — G510 Bell's palsy: Secondary | ICD-10-CM

## 2018-02-22 DIAGNOSIS — R29810 Facial weakness: Secondary | ICD-10-CM | POA: Diagnosis not present

## 2018-02-22 DIAGNOSIS — F17210 Nicotine dependence, cigarettes, uncomplicated: Secondary | ICD-10-CM | POA: Diagnosis not present

## 2018-02-22 LAB — I-STAT CHEM 8, ED
BUN: 10 mg/dL (ref 6–20)
CHLORIDE: 105 mmol/L (ref 98–111)
Calcium, Ion: 1.11 mmol/L — ABNORMAL LOW (ref 1.15–1.40)
Creatinine, Ser: 0.7 mg/dL (ref 0.44–1.00)
GLUCOSE: 101 mg/dL — AB (ref 70–99)
HEMATOCRIT: 42 % (ref 36.0–46.0)
Hemoglobin: 14.3 g/dL (ref 12.0–15.0)
Potassium: 3.8 mmol/L (ref 3.5–5.1)
Sodium: 140 mmol/L (ref 135–145)
TCO2: 25 mmol/L (ref 22–32)

## 2018-02-22 LAB — I-STAT BETA HCG BLOOD, ED (MC, WL, AP ONLY)

## 2018-02-22 MED ORDER — VALACYCLOVIR HCL 1 G PO TABS: 1000.0000 mg | ORAL_TABLET | Freq: Three times a day (TID) | ORAL | 0 refills | Status: AC

## 2018-02-22 MED ORDER — PREDNISONE 20 MG PO TABS: 60.0000 mg | ORAL_TABLET | Freq: Every day | ORAL | 0 refills | Status: AC

## 2018-02-22 MED ORDER — GADOBUTROL 1 MMOL/ML IV SOLN
7.0000 mL | Freq: Once | INTRAVENOUS | Status: AC | PRN
  Administered 2018-02-22: 7 mL via INTRAVENOUS

## 2018-02-22 NOTE — ED Triage Notes (Signed)
Pt. Stated, last night she noticed some numbness on the left side of her face. It started on my eye and going down. VAN neg. nonsymmetrical smile Left side of eye movement drooping when squinting eyes

## 2018-02-22 NOTE — ED Notes (Signed)
Patient transported to MRI 

## 2018-02-22 NOTE — ED Provider Notes (Signed)
[facility] EMERGENCY DEPARTMENT Provider Note  CSN: [id number] Arrival date & time: 02/22/18  0944   History   Chief Complaint Chief Complaint  Patient presents with  . Weakness   HPI Rebecca Richmond is a 32 y.o. female with no significant past medical history presents for evaluation of left sided facial weakness. This began at approximately 8pm yesterday. States she noticed that her eye was difficulty to close. Has had some tingling to there entire left side of her face. States as the night progressed her mouth and smile was asymtetrical. When she awoke this morning she goggled her symptoms and was worried that she was having a stroke or Bells palsy. She did a tele visit with a physician with her insurance company which they stated they thought she had Bells palsy however she needed to be evaluated in the ED. States she feels like she has to hold her left side of her mouth up to swallow liquids. Denies headache, fever, chills, nausea, vomiting, chest pain, SOB, speech difficulty, gait abnormality, unilateral extremity weakness. Denies history of tick bite.  Admits to mild intermittent tingling of her left arm and is currently on birth control.  HPI  Past Medical History:  Diagnosis Date  . No pertinent past medical history     Patient Active Problem List   Diagnosis Date Noted  . Pregnancy 08/11/2015    Past Surgical History:  Procedure Laterality Date  . ANTERIOR CRUCIATE LIGAMENT REPAIR     R knee x2  . CESAREAN SECTION N/A 08/11/2015   Procedure: CESAREAN SECTION;  Surgeon: Richarda Overlie, MD;  Location: [facility];  Service: Obstetrics;  Laterality: N/A;  . WISDOM TOOTH EXTRACTION       OB History    Gravida  2   Para  2   Term  0   Preterm  2   AB  0   Living  2     SAB  0   TAB  0   Ectopic  0   Multiple  0   Live Births  2            Home Medications    Prior to Admission medications   Medication Sig Start Date End Date  Taking? Authorizing Provider  ibuprofen (ADVIL,MOTRIN) 800 MG tablet Take 1 tablet (800 mg total) by mouth every 8 (eight) hours as needed for moderate pain. 08/13/15   Richarda Overlie, MD  oxyCODONE-acetaminophen (PERCOCET/ROXICET) 5-325 MG tablet Take 2 tablets by mouth every 4 (four) hours as needed (pain scale > 7). 08/13/15   Richarda Overlie, MD  predniSONE (DELTASONE) 20 MG tablet Take 3 tablets (60 mg total) by mouth daily for 7 days. 02/22/18 03/01/18  Denaja Verhoeven A, PA-C  Prenatal Vit-Fe Fumarate-FA (PRENATAL MULTIVITAMIN) TABS tablet Take 1 tablet by mouth daily at 12 noon. 08/13/15   Richarda Overlie, MD  valACYclovir (VALTREX) 1000 MG tablet Take 1 tablet (1,000 mg total) by mouth 3 (three) times daily for 7 days. 02/22/18 03/01/18  Kewan Mcnease A, PA-C    Family History Family History  Problem Relation Age of Onset  . Anesthesia problems Neg Hx   . Hypotension Neg Hx   . Malignant hyperthermia Neg Hx   . Pseudochol deficiency Neg Hx     Social History Social History   Tobacco Use  . Smoking status: Current Every Day Smoker  . Smokeless tobacco: Current User  Substance Use Topics  . Alcohol use: Yes  . Drug use: No  Allergies   Patient has no known allergies.   Review of Systems Review of Systems  Constitutional: Negative for activity change, appetite change, chills, diaphoresis, fatigue, fever and unexpected weight change.  Eyes:       Difficulty closing left eye.  Respiratory: Negative.   Cardiovascular: Negative.   Gastrointestinal: Negative.   Musculoskeletal: Negative.   Neurological: Positive for facial asymmetry. Negative for dizziness, tremors, seizures, syncope, speech difficulty, weakness, light-headedness, numbness and headaches.     Physical Exam Updated Vital Signs BP 129/84   Pulse 73   Temp 98.3 F (36.8 C) (Oral)   Resp 18   Ht 5' (1.524 m)   Wt 71.2 kg   LMP 02/12/2018   SpO2 100%   BMI 30.66 kg/m   Physical  Exam Physical Exam  Constitutional: Pt is oriented to person, place, and time. Pt appears well-developed and well-nourished. No distress.  HENT:  Head: Normocephalic and atraumatic.  Mouth/Throat: Oropharynx is clear and moist.  Eyes: Conjunctivae and EOM are normal. Pupils are equal, round, and reactive to light. No scleral icterus.  Difficulty closing left eye. Left eye does not blink as frequently as right eye. Asymmetric eyebrow raise to left eyebrow. Weakness with closing eyelids. No horizontal, vertical or rotational nystagmus  Neck: Normal range of motion. Neck supple.  Full active and passive ROM without pain No midline or paraspinal tenderness No nuchal rigidity or meningeal signs  Cardiovascular: Normal rate, regular rhythm and intact distal pulses.   Pulmonary/Chest: Effort normal and breath sounds normal. No respiratory distress. Pt has no wheezes. No rales.  Abdominal: Soft. Bowel sounds are normal. There is no tenderness. There is no rebound and no guarding.  Musculoskeletal: Normal range of motion.  Lymphadenopathy:    No cervical adenopathy.  Neurological: Pt. is alert and oriented to person, place, and time. He has normal reflexes. No cranial nerve deficit.  Exhibits normal muscle tone. Coordination normal.  Mental Status:  Alert, oriented, thought content appropriate. Speech fluent without evidence of aphasia. Able to follow 2 step commands without difficulty.  Cranial Nerves:  II:  Peripheral visual fields grossly normal, pupils equal, round, reactive to light III,IV, VI: Mild ptosis on left eye, extra-ocular motions intact bilaterally  V,VII: smile asymmetric, facial light touch sensation equal VIII: hearing grossly normal bilaterally  IX,X: midline uvula rise  XI: bilateral shoulder shrug equal and strong XII: midline tongue extension  Motor:  5/5 in upper and lower extremities bilaterally including strong and equal grip strength and dorsiflexion/plantar  flexion Sensory: Pinprick and light touch normal in all extremities.  Deep Tendon Reflexes: 2+ and symmetric  Cerebellar: normal finger-to-nose with bilateral upper extremities Gait: normal gait and balance CV: distal pulses palpable throughout   Skin: Skin is warm and dry. No rash noted. Pt is not diaphoretic.  Psychiatric: Pt has a normal mood and affect. Behavior is normal. Judgment and thought content normal.  Nursing note and vitals reviewed.  ED Treatments / Results  Labs (all labs ordered are listed, but only abnormal results are displayed) Labs Reviewed  I-STAT CHEM 8, ED - Abnormal; Notable for the following components:      Result Value   Glucose, Bld 101 (*)    Calcium, Ion 1.11 (*)    All other components within normal limits  I-STAT BETA HCG BLOOD, ED ([facility], [facility], [facility] ONLY)    EKG None  Radiology Mr Laqueta Jean And Wo Contrast  Result Date: 02/22/2018 CLINICAL DATA:  Left-sided weakness and left  facial paralysis. EXAM: MRI HEAD WITHOUT AND WITH CONTRAST TECHNIQUE: Multiplanar, multiecho pulse sequences of the brain and surrounding structures were obtained without and with intravenous contrast. CONTRAST:  7 mL Gadavist COMPARISON:  None. FINDINGS: BRAIN: There is no acute infarct, acute hemorrhage, hydrocephalus or extra-axial collection. The midline structures are normal. No midline shift or other mass effect. There are no old infarcts. The white matter signal is normal for the patient's age. The cerebral and cerebellar volume are age-appropriate. Susceptibility-sensitive sequences show no chronic microhemorrhage or superficial siderosis. No abnormal contrast enhancement. VASCULAR: Major intracranial arterial and venous sinus flow voids are normal. SKULL AND UPPER CERVICAL SPINE: Calvarial bone marrow signal is normal. There is no skull base mass. Visualized upper cervical spine and soft tissues are normal. SINUSES/ORBITS: No fluid levels or advanced mucosal thickening. No mastoid or  middle ear effusion. The orbits are normal. IMPRESSION: Normal brain. Electronically Signed   By: Deatra Robinson M.D.   On: 02/22/2018 18:14    Procedures Procedures (including critical care time)  Medications Ordered in ED Medications  gadobutrol (GADAVIST) 1 MMOL/ML injection 7 mL (7 mLs Intravenous Contrast Given 02/22/18 1730)     Initial Impression / Assessment and Plan / ED Course  I have reviewed the triage vital signs and the nursing notes as well as past medical history.  Pertinent labs & imaging results that were available during my care of the patient were reviewed by me and considered in my medical decision making (see chart for details).  32 year old otherwise healthy female presents for evaluation of left-sided facial weakness.  Onset yesterday evening at approximately 8 PM.  Given history and physical exam the patient most likely has Bell's palsy.  Low suspicion for ischemic event or brain mass.   Consulted with neurology Dr. Otelia Limes. He recommends MRI brain with and without contrast.  If negative patient can be DC with prednisone and valacyclovir for Bell's palsy  MRI negative. Will dc home with prednisone and valacyclovir. Discussed with patient eye ointment and taping of her eye to prevent corneal ulceration.  Discussed return precautions with patient.  Will patient follow-up with primary care provider within the next week.  Patient and family voiced understanding and are agreeable for discharge.  Patient was seen and evaluated by my attending physician, Dr. Madilyn Hook who agrees with disposition and plan of patient.    Final Clinical Impressions(s) / ED Diagnoses   Final diagnoses:  Bell's palsy    ED Discharge Orders         Ordered    valACYclovir (VALTREX) 1000 MG tablet  3 times daily     02/22/18 1823    predniSONE (DELTASONE) 20 MG tablet  Daily     02/22/18 1823           Tavin Vernet A, PA-C 02/22/18 Emiliano Dyer, MD 02/24/18 1553

## 2018-02-22 NOTE — Discharge Instructions (Addendum)
Evaluated today for weakness.  Your MRI was negative.  You most likely have what is called Bell's palsy.  I prescribe ED prescriptions.  Please take as prescribed.  Please take your left eye shut at night and use lubricating eyedrops until your symptoms resolved.  Please follow-up with your primary care provider or return to the ED with any new or worsening symptoms:  Contact a health care provider if: You have a fever. Your symptoms do not get better within 2-3 weeks, or your symptoms get worse. Your eye is red, irritated, or painful. You have new symptoms. Get help right away if: You have weakness or numbness in a part of your body other than your face. You have trouble swallowing. You develop neck pain or stiffness. You develop dizziness or shortness of breath.

## 2018-02-26 DIAGNOSIS — Z6829 Body mass index (BMI) 29.0-29.9, adult: Secondary | ICD-10-CM | POA: Diagnosis not present

## 2018-02-26 DIAGNOSIS — G510 Bell's palsy: Secondary | ICD-10-CM | POA: Diagnosis not present

## 2019-02-04 DIAGNOSIS — R87810 Cervical high risk human papillomavirus (HPV) DNA test positive: Secondary | ICD-10-CM | POA: Diagnosis not present

## 2019-02-04 DIAGNOSIS — Z6829 Body mass index (BMI) 29.0-29.9, adult: Secondary | ICD-10-CM | POA: Diagnosis not present

## 2019-02-04 DIAGNOSIS — R87612 Low grade squamous intraepithelial lesion on cytologic smear of cervix (LGSIL): Secondary | ICD-10-CM | POA: Diagnosis not present

## 2019-02-04 DIAGNOSIS — Z01419 Encounter for gynecological examination (general) (routine) without abnormal findings: Secondary | ICD-10-CM | POA: Diagnosis not present

## 2019-02-25 DIAGNOSIS — R87610 Atypical squamous cells of undetermined significance on cytologic smear of cervix (ASC-US): Secondary | ICD-10-CM | POA: Diagnosis not present

## 2019-02-25 DIAGNOSIS — N870 Mild cervical dysplasia: Secondary | ICD-10-CM | POA: Diagnosis not present

## 2019-03-15 DIAGNOSIS — N870 Mild cervical dysplasia: Secondary | ICD-10-CM | POA: Diagnosis not present

## 2019-03-15 DIAGNOSIS — R87612 Low grade squamous intraepithelial lesion on cytologic smear of cervix (LGSIL): Secondary | ICD-10-CM | POA: Diagnosis not present

## 2019-03-15 DIAGNOSIS — Z3202 Encounter for pregnancy test, result negative: Secondary | ICD-10-CM | POA: Diagnosis not present

## 2019-04-01 DIAGNOSIS — Z09 Encounter for follow-up examination after completed treatment for conditions other than malignant neoplasm: Secondary | ICD-10-CM | POA: Diagnosis not present

## 2019-06-11 ENCOUNTER — Other Ambulatory Visit: Payer: BC Managed Care – PPO

## 2019-06-11 DIAGNOSIS — Z20822 Contact with and (suspected) exposure to covid-19: Secondary | ICD-10-CM | POA: Diagnosis not present

## 2019-06-11 DIAGNOSIS — J029 Acute pharyngitis, unspecified: Secondary | ICD-10-CM | POA: Diagnosis not present

## 2019-06-11 DIAGNOSIS — R519 Headache, unspecified: Secondary | ICD-10-CM | POA: Diagnosis not present

## 2019-06-11 DIAGNOSIS — R0981 Nasal congestion: Secondary | ICD-10-CM | POA: Diagnosis not present

## 2019-11-14 IMAGING — MR MR HEAD WO/W CM
10 of 12 series · 34 of 48 positions shown · IV contrast (gadavist)
Comparison: None.

CLINICAL DATA: Left-sided weakness and left facial paralysis.

EXAM:
MRI HEAD WITHOUT AND WITH CONTRAST
TECHNIQUE: Multiplanar, multiecho pulse sequences of the brain and surrounding
structures were obtained without and with intravenous contrast.
CONTRAST:  7 mL Gadavist

[Series 4: DWI · axial · 3.0mm · 0.94mm/px · z∈[-184,-38]mm · 8 of 100 slices shown (1 of 2)]
[im 1/100]
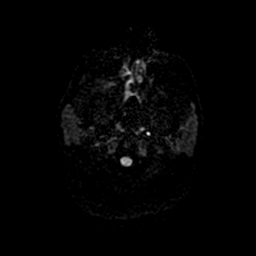
[im 15/100]
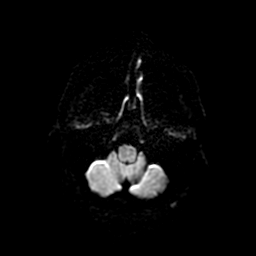
[im 29/100]
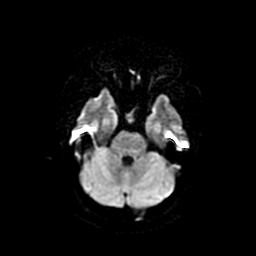
[im 43/100]
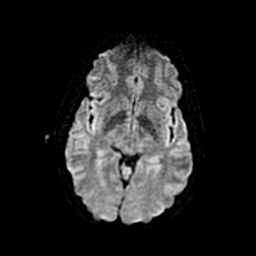
[im 57/100]
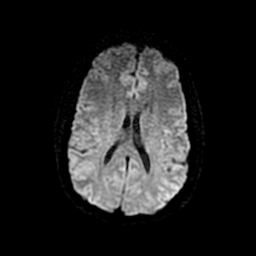
[im 71/100]
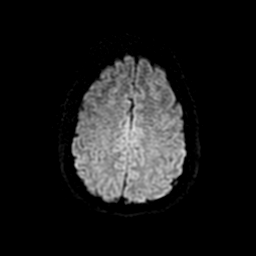
[im 85/100]
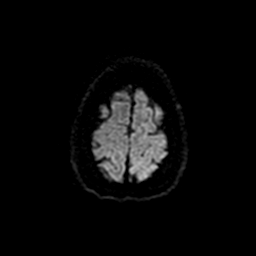
[im 100/100]
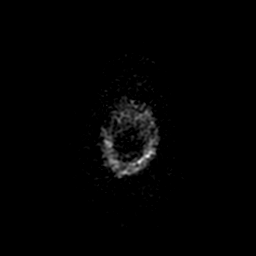

[Series 5: DWI · coronal · 4.0mm · 0.94mm/px · 6 of 72 slices shown (2 of 2)]
[im 1/72]
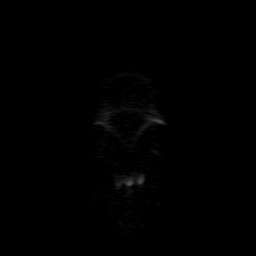
[im 15/72]
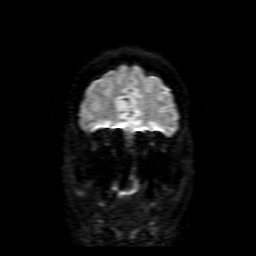
[im 29/72]
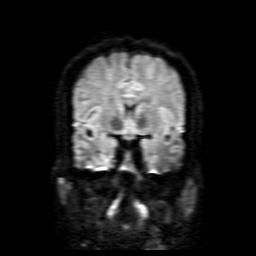
[im 43/72]
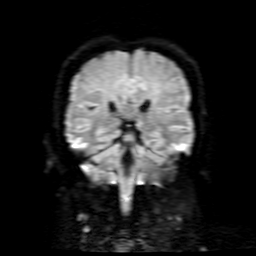
[im 57/72]
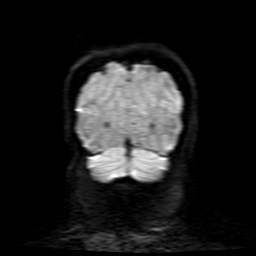
[im 72/72]
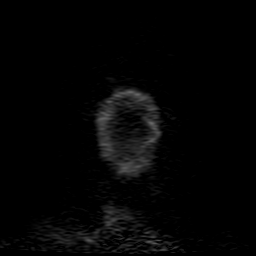

[Series 6: FLAIR · sagittal · 5.0mm · 0.47mm/px · 2 of 24 slices shown (1 of 2)]
[im 1/24]
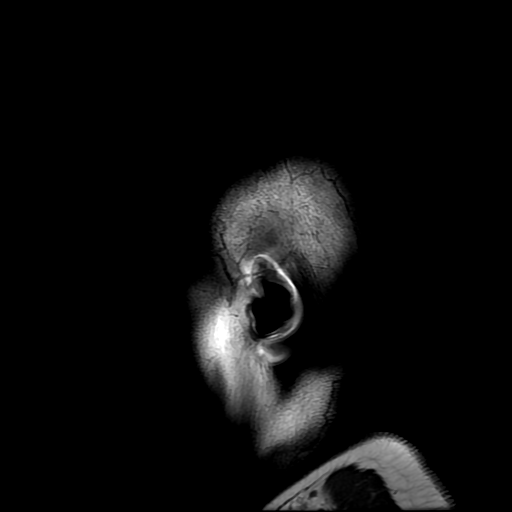
[im 24/24]
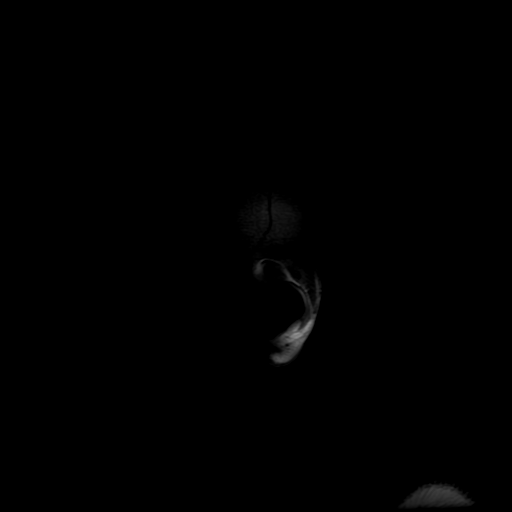

[Series 8: T2 · axial · 5.0mm · 0.47mm/px · z∈[-209,-50]mm · 2 of 28 slices shown]
[im 1/28]
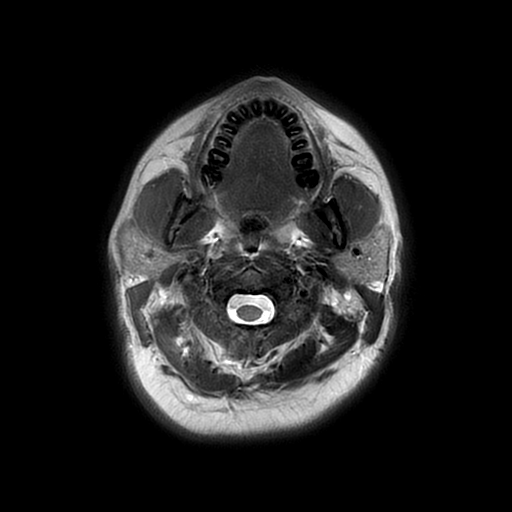
[im 28/28]
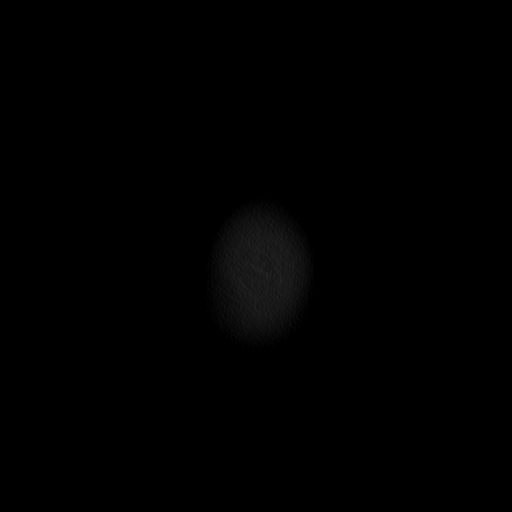

[Series 9: FLAIR · axial · 3.0mm · 0.45mm/px · z∈[-208,-49]mm · 2 of 28 slices shown (2 of 2)]
[im 1/28]
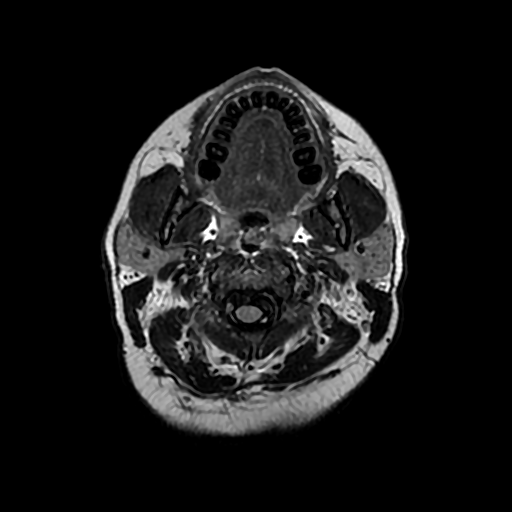
[im 28/28]
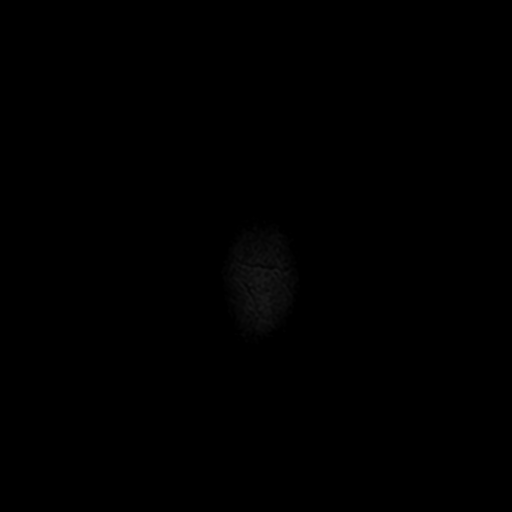

[Series 10: (person_name) · axial · 3.0mm · 0.47mm/px · z∈[-211,-171]mm · 3 of 112 slices shown]
[im 1/112]
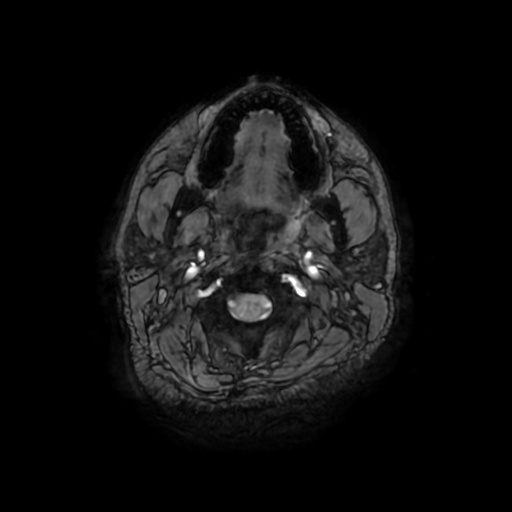
[im 14/112]
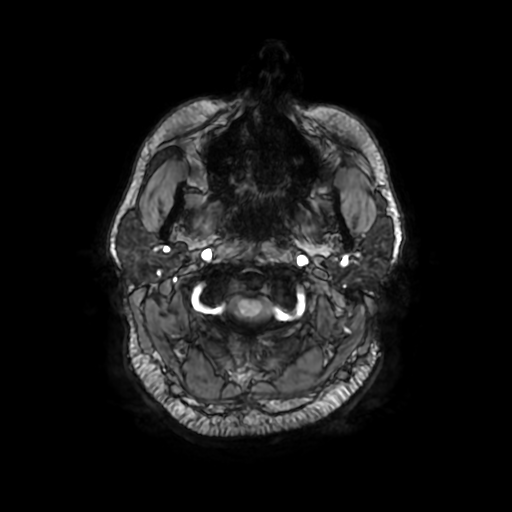
[im 28/112]
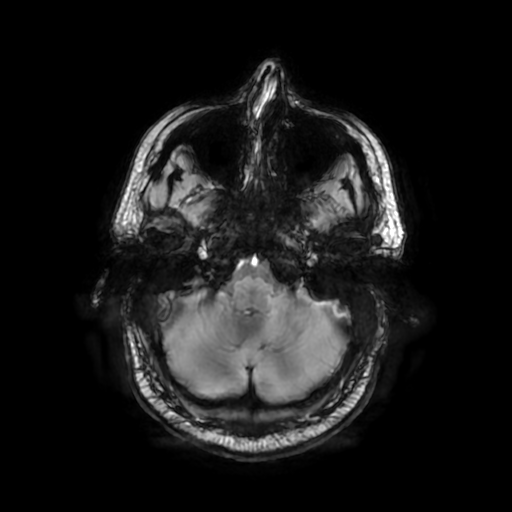

[Series 12: T2 post-contrast · coronal · 5.0mm · 0.47mm/px · 2 of 31 slices shown]
[im 1/31]
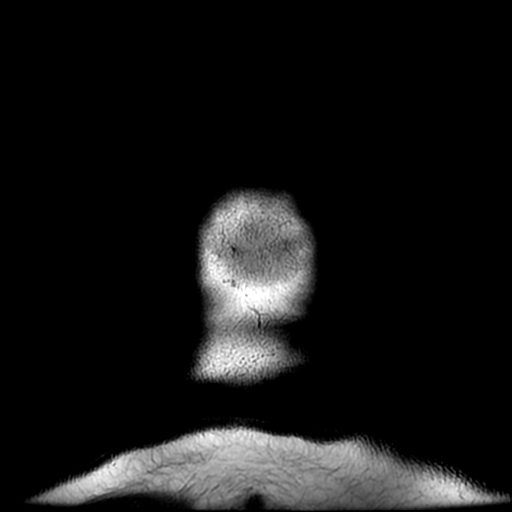
[im 31/31]
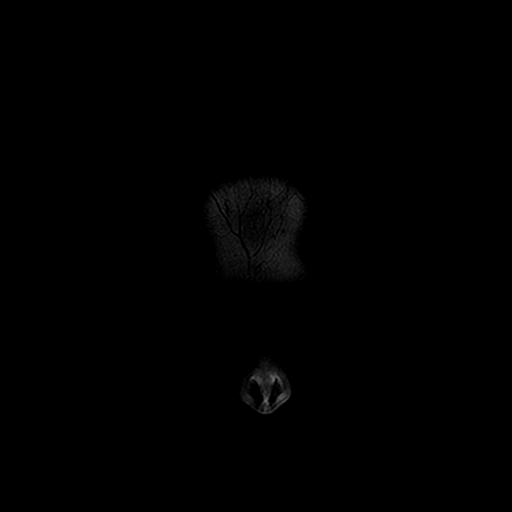

[Series 14: T1 · coronal · 5.0mm · 0.47mm/px · 2 of 31 slices shown]
[im 1/31]
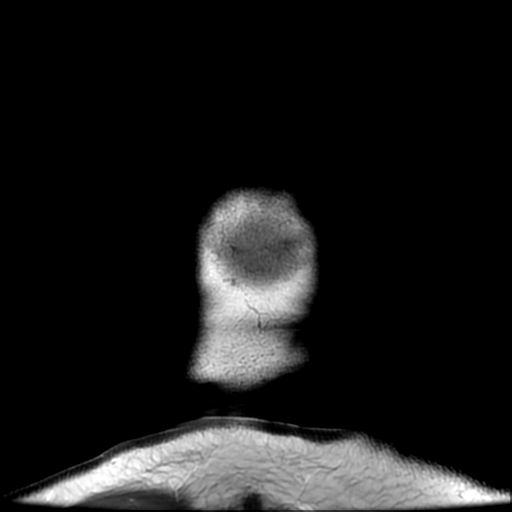
[im 31/31]
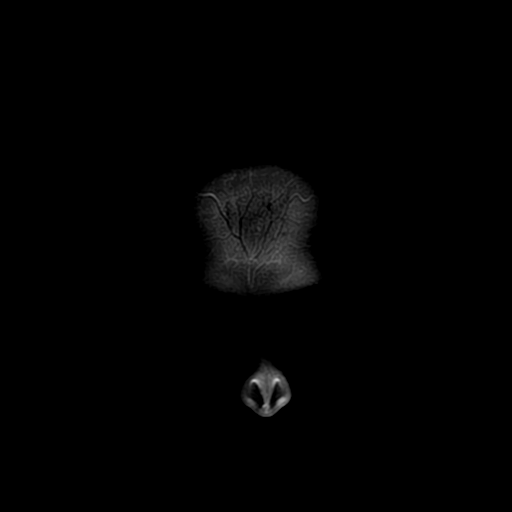

[Series 450: ADC · axial · 3.0mm · 0.94mm/px · z∈[-184,-38]mm · 4 of 50 slices shown (1 of 2)]
[im 1/50]
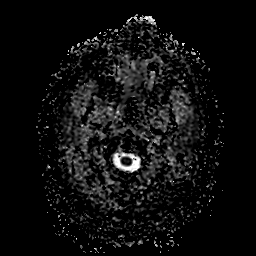
[im 17/50]
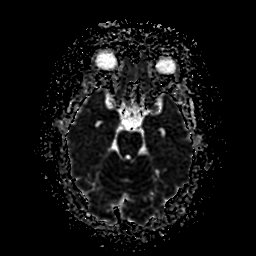
[im 33/50]
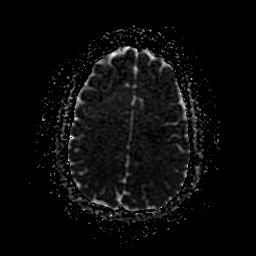
[im 50/50]
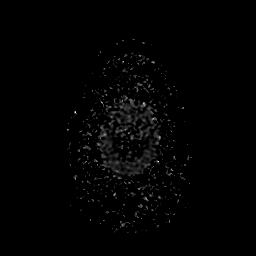

[Series 550: ADC · coronal · 4.0mm · 0.94mm/px · 3 of 36 slices shown (2 of 2)]
[im 1/36]
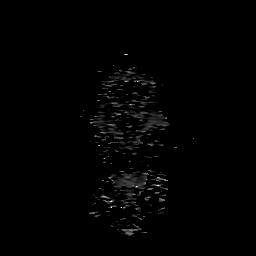
[im 18/36]
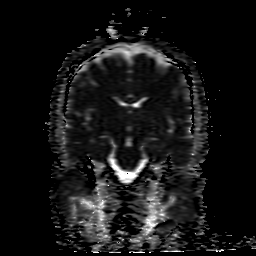
[im 36/36]
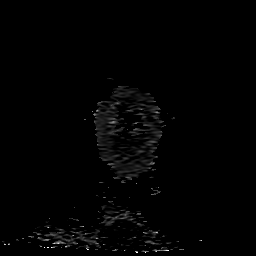

[34 of 48 positions shown; findings below may reference images not displayed]

FINDINGS: BRAIN: There is no acute infarct, acute hemorrhage, hydrocephalus or
extra-axial collection. The midline structures are normal. No
midline shift or other mass effect. There are no old infarcts. The
white matter signal is normal for the patient's age. The cerebral
and cerebellar volume are age-appropriate. Susceptibility-sensitive
sequences show no chronic microhemorrhage or superficial siderosis.
No abnormal contrast enhancement.

VASCULAR: Major intracranial arterial and venous sinus flow voids
are normal.

SKULL AND UPPER CERVICAL SPINE: Calvarial bone marrow signal is
normal. There is no skull base mass. Visualized upper cervical spine
and soft tissues are normal.

SINUSES/ORBITS: No fluid levels or advanced mucosal thickening. No
mastoid or middle ear effusion. The orbits are normal.
IMPRESSION: Normal brain.

## 2020-02-16 DIAGNOSIS — Z01419 Encounter for gynecological examination (general) (routine) without abnormal findings: Secondary | ICD-10-CM | POA: Diagnosis not present

## 2020-02-16 DIAGNOSIS — Z3040 Encounter for surveillance of contraceptives, unspecified: Secondary | ICD-10-CM | POA: Diagnosis not present

## 2020-02-16 DIAGNOSIS — Z6829 Body mass index (BMI) 29.0-29.9, adult: Secondary | ICD-10-CM | POA: Diagnosis not present

## 2020-03-02 DIAGNOSIS — D2372 Other benign neoplasm of skin of left lower limb, including hip: Secondary | ICD-10-CM | POA: Diagnosis not present

## 2020-03-02 DIAGNOSIS — L918 Other hypertrophic disorders of the skin: Secondary | ICD-10-CM | POA: Diagnosis not present

## 2020-03-02 DIAGNOSIS — D485 Neoplasm of uncertain behavior of skin: Secondary | ICD-10-CM | POA: Diagnosis not present

## 2020-03-02 DIAGNOSIS — D225 Melanocytic nevi of trunk: Secondary | ICD-10-CM | POA: Diagnosis not present

## 2021-03-15 DIAGNOSIS — Z01419 Encounter for gynecological examination (general) (routine) without abnormal findings: Secondary | ICD-10-CM | POA: Diagnosis not present

## 2021-03-15 DIAGNOSIS — Z6831 Body mass index (BMI) 31.0-31.9, adult: Secondary | ICD-10-CM | POA: Diagnosis not present

## 2021-04-16 DIAGNOSIS — Z30430 Encounter for insertion of intrauterine contraceptive device: Secondary | ICD-10-CM | POA: Diagnosis not present

## 2021-04-16 DIAGNOSIS — Z3202 Encounter for pregnancy test, result negative: Secondary | ICD-10-CM | POA: Diagnosis not present

## 2021-05-31 DIAGNOSIS — Z30431 Encounter for routine checking of intrauterine contraceptive device: Secondary | ICD-10-CM | POA: Diagnosis not present

## 2022-01-23 DIAGNOSIS — R202 Paresthesia of skin: Secondary | ICD-10-CM | POA: Diagnosis not present

## 2022-01-23 DIAGNOSIS — G510 Bell's palsy: Secondary | ICD-10-CM | POA: Diagnosis not present

## 2022-01-23 DIAGNOSIS — J3089 Other allergic rhinitis: Secondary | ICD-10-CM | POA: Diagnosis not present

## 2022-03-25 DIAGNOSIS — Z6832 Body mass index (BMI) 32.0-32.9, adult: Secondary | ICD-10-CM | POA: Diagnosis not present

## 2022-03-25 DIAGNOSIS — Z01419 Encounter for gynecological examination (general) (routine) without abnormal findings: Secondary | ICD-10-CM | POA: Diagnosis not present

## 2023-02-15 DIAGNOSIS — R197 Diarrhea, unspecified: Secondary | ICD-10-CM | POA: Diagnosis not present

## 2023-03-10 ENCOUNTER — Other Ambulatory Visit (HOSPITAL_BASED_OUTPATIENT_CLINIC_OR_DEPARTMENT_OTHER): Payer: Self-pay

## 2023-03-10 MED ORDER — ALBUTEROL SULFATE HFA 108 (90 BASE) MCG/ACT IN AERS
2.0000 | INHALATION_SPRAY | Freq: Four times a day (QID) | RESPIRATORY_TRACT | 0 refills | Status: AC | PRN
  Filled 2023-03-10: qty 18, 14d supply, fill #0

## 2023-03-11 ENCOUNTER — Other Ambulatory Visit (HOSPITAL_BASED_OUTPATIENT_CLINIC_OR_DEPARTMENT_OTHER): Payer: Self-pay

## 2023-03-13 ENCOUNTER — Other Ambulatory Visit (HOSPITAL_BASED_OUTPATIENT_CLINIC_OR_DEPARTMENT_OTHER): Payer: Self-pay

## 2023-03-13 MED ORDER — AZITHROMYCIN 250 MG PO TABS
ORAL_TABLET | ORAL | 0 refills | Status: AC
  Filled 2023-03-13: qty 6, 5d supply, fill #0

## 2023-04-18 DIAGNOSIS — Z6832 Body mass index (BMI) 32.0-32.9, adult: Secondary | ICD-10-CM | POA: Diagnosis not present

## 2023-04-18 DIAGNOSIS — Z01419 Encounter for gynecological examination (general) (routine) without abnormal findings: Secondary | ICD-10-CM | POA: Diagnosis not present

## 2023-04-30 ENCOUNTER — Other Ambulatory Visit: Payer: Self-pay
